# Patient Record
Sex: Female | Born: 1945 | Race: White | Hispanic: No | Smoking: Never smoker
Health system: Southern US, Community
[De-identification: ages and names within clinical notes are randomized; demographics above are authoritative.]

## PROBLEM LIST (undated history)

## (undated) DIAGNOSIS — F909 Attention-deficit hyperactivity disorder, unspecified type: Secondary | ICD-10-CM

## (undated) DIAGNOSIS — R61 Generalized hyperhidrosis: Secondary | ICD-10-CM

## (undated) DIAGNOSIS — L659 Nonscarring hair loss, unspecified: Secondary | ICD-10-CM

## (undated) DIAGNOSIS — IMO0002 Reserved for concepts with insufficient information to code with codable children: Secondary | ICD-10-CM

## (undated) DIAGNOSIS — F32A Depression, unspecified: Secondary | ICD-10-CM

## (undated) DIAGNOSIS — M75101 Unspecified rotator cuff tear or rupture of right shoulder, not specified as traumatic: Secondary | ICD-10-CM

## (undated) DIAGNOSIS — E669 Obesity, unspecified: Secondary | ICD-10-CM

## (undated) DIAGNOSIS — F419 Anxiety disorder, unspecified: Secondary | ICD-10-CM

## (undated) DIAGNOSIS — M79674 Pain in right toe(s): Secondary | ICD-10-CM

## (undated) DIAGNOSIS — L603 Nail dystrophy: Secondary | ICD-10-CM

## (undated) DIAGNOSIS — M1990 Unspecified osteoarthritis, unspecified site: Secondary | ICD-10-CM

## (undated) DIAGNOSIS — F329 Major depressive disorder, single episode, unspecified: Secondary | ICD-10-CM

## (undated) DIAGNOSIS — K219 Gastro-esophageal reflux disease without esophagitis: Secondary | ICD-10-CM

## (undated) DIAGNOSIS — G4700 Insomnia, unspecified: Secondary | ICD-10-CM

## (undated) DIAGNOSIS — C049 Malignant neoplasm of floor of mouth, unspecified: Secondary | ICD-10-CM

## (undated) DIAGNOSIS — M2040 Other hammer toe(s) (acquired), unspecified foot: Secondary | ICD-10-CM

## (undated) DIAGNOSIS — M797 Fibromyalgia: Secondary | ICD-10-CM

## (undated) DIAGNOSIS — M19049 Primary osteoarthritis, unspecified hand: Secondary | ICD-10-CM

## (undated) DIAGNOSIS — I10 Essential (primary) hypertension: Secondary | ICD-10-CM

## (undated) HISTORY — DX: Attention-deficit hyperactivity disorder, unspecified type: F90.9

## (undated) HISTORY — DX: Major depressive disorder, single episode, unspecified: F32.9

## (undated) HISTORY — DX: Primary osteoarthritis, unspecified hand: M19.049

## (undated) HISTORY — DX: Essential (primary) hypertension: I10

## (undated) HISTORY — DX: Nail dystrophy: L60.3

## (undated) HISTORY — DX: Malignant neoplasm of floor of mouth, unspecified: C04.9

## (undated) HISTORY — DX: Obesity, unspecified: E66.9

## (undated) HISTORY — DX: Depression, unspecified: F32.A

## (undated) HISTORY — DX: Unspecified osteoarthritis, unspecified site: M19.90

## (undated) HISTORY — DX: Unspecified rotator cuff tear or rupture of right shoulder, not specified as traumatic: M75.101

## (undated) HISTORY — DX: Pain in right toe(s): M79.674

## (undated) HISTORY — DX: Reserved for concepts with insufficient information to code with codable children: IMO0002

## (undated) HISTORY — DX: Other hammer toe(s) (acquired), unspecified foot: M20.40

## (undated) HISTORY — DX: Gastro-esophageal reflux disease without esophagitis: K21.9

## (undated) HISTORY — DX: Fibromyalgia: M79.7

## (undated) HISTORY — DX: Generalized hyperhidrosis: R61

## (undated) HISTORY — DX: Anxiety disorder, unspecified: F41.9

## (undated) HISTORY — DX: Nonscarring hair loss, unspecified: L65.9

## (undated) HISTORY — DX: Insomnia, unspecified: G47.00

---

## 1982-07-11 HISTORY — PX: VESICOVAGINAL FISTULA CLOSURE W/ TAH: SUR271

## 1993-07-11 HISTORY — PX: TONSILLECTOMY: SUR1361

## 1993-07-11 HISTORY — PX: OTHER SURGICAL HISTORY: SHX169

## 1998-04-29 ENCOUNTER — Encounter: Admission: RE | Admit: 1998-04-29 | Discharge: 1998-07-28 | Payer: Self-pay

## 1998-10-16 ENCOUNTER — Ambulatory Visit (HOSPITAL_COMMUNITY): Admission: RE | Admit: 1998-10-16 | Discharge: 1998-10-16 | Payer: Self-pay | Admitting: Neurosurgery

## 1998-10-26 ENCOUNTER — Ambulatory Visit (HOSPITAL_COMMUNITY): Admission: RE | Admit: 1998-10-26 | Discharge: 1998-10-26 | Payer: Self-pay | Admitting: Neurosurgery

## 2000-09-22 ENCOUNTER — Encounter: Payer: Self-pay | Admitting: Critical Care Medicine

## 2000-09-22 ENCOUNTER — Ambulatory Visit (HOSPITAL_COMMUNITY): Admission: RE | Admit: 2000-09-22 | Discharge: 2000-09-22 | Payer: Self-pay | Admitting: Critical Care Medicine

## 2004-12-31 ENCOUNTER — Encounter: Admission: RE | Admit: 2004-12-31 | Discharge: 2005-03-31 | Payer: Self-pay | Admitting: Orthotics

## 2005-04-01 ENCOUNTER — Encounter: Admission: RE | Admit: 2005-04-01 | Discharge: 2005-06-30 | Payer: Self-pay | Admitting: Orthotics

## 2009-09-02 DIAGNOSIS — C049 Malignant neoplasm of floor of mouth, unspecified: Secondary | ICD-10-CM

## 2009-09-02 HISTORY — DX: Malignant neoplasm of floor of mouth, unspecified: C04.9

## 2009-09-21 HISTORY — PX: OTHER SURGICAL HISTORY: SHX169

## 2010-03-29 ENCOUNTER — Encounter
Admission: RE | Admit: 2010-03-29 | Discharge: 2010-06-27 | Payer: Self-pay | Source: Home / Self Care | Attending: Family Medicine | Admitting: Family Medicine

## 2010-11-30 ENCOUNTER — Encounter: Payer: Self-pay | Admitting: Nurse Practitioner

## 2011-07-19 ENCOUNTER — Other Ambulatory Visit (HOSPITAL_COMMUNITY)
Admission: RE | Admit: 2011-07-19 | Discharge: 2011-07-19 | Disposition: A | Discharge status: Home / Self Care | Payer: Medicare Other | Source: Ambulatory Visit | Attending: Obstetrics & Gynecology | Admitting: Obstetrics & Gynecology

## 2011-07-19 DIAGNOSIS — Z1213 Encounter for screening for malignant neoplasm of small intestine: Secondary | ICD-10-CM | POA: Insufficient documentation

## 2011-09-29 LAB — HM DIABETES EYE EXAM

## 2012-09-26 ENCOUNTER — Encounter: Payer: Self-pay | Admitting: Family Medicine

## 2012-09-26 ENCOUNTER — Ambulatory Visit (INDEPENDENT_AMBULATORY_CARE_PROVIDER_SITE_OTHER): Payer: 59 | Admitting: Family Medicine

## 2012-09-26 VITALS — BP 124/68 | HR 84 | Temp 98.5°F | Ht 63.5 in | Wt 184.2 lb

## 2012-09-26 DIAGNOSIS — F900 Attention-deficit hyperactivity disorder, predominantly inattentive type: Secondary | ICD-10-CM

## 2012-09-26 DIAGNOSIS — Z7251 High risk heterosexual behavior: Secondary | ICD-10-CM

## 2012-09-26 DIAGNOSIS — R61 Generalized hyperhidrosis: Secondary | ICD-10-CM

## 2012-09-26 DIAGNOSIS — F411 Generalized anxiety disorder: Secondary | ICD-10-CM

## 2012-09-26 DIAGNOSIS — F988 Other specified behavioral and emotional disorders with onset usually occurring in childhood and adolescence: Secondary | ICD-10-CM

## 2012-09-26 DIAGNOSIS — Z209 Contact with and (suspected) exposure to unspecified communicable disease: Secondary | ICD-10-CM

## 2012-09-26 DIAGNOSIS — J069 Acute upper respiratory infection, unspecified: Secondary | ICD-10-CM

## 2012-09-26 DIAGNOSIS — L74519 Primary focal hyperhidrosis, unspecified: Secondary | ICD-10-CM

## 2012-09-26 DIAGNOSIS — M549 Dorsalgia, unspecified: Secondary | ICD-10-CM

## 2012-09-26 LAB — POCT URINALYSIS DIPSTICK
Bilirubin, UA: NEGATIVE
Blood, UA: NEGATIVE
Glucose, UA: NEGATIVE
Ketones, UA: NEGATIVE
Leukocytes, UA: NEGATIVE
Nitrite, UA: NEGATIVE
Protein, UA: NEGATIVE
Spec Grav, UA: 1.02
Urobilinogen, UA: NEGATIVE
pH, UA: 6

## 2012-09-26 LAB — HIV ANTIBODY (ROUTINE TESTING W REFLEX): HIV: NONREACTIVE

## 2012-09-26 LAB — RPR

## 2012-09-26 MED ORDER — ALPRAZOLAM 0.5 MG PO TABS: 0.5000 mg | ORAL_TABLET | Freq: Every evening | ORAL | Status: DC | PRN

## 2012-09-26 NOTE — Progress Notes (Signed)
Subjective:     Patient ID: Carla Watkins, female   DOB: April 15, 1946, 67 y.o.   MRN: [id number]  HPI Would like to get STD screening. Her partner of 30 years is involved with someone else sexually. In this encounter that her partner has is with somebody who is known to have a substance abuse problem. The patient is extremely scared of contracting a STD. She has no swollen glands. No rashes no vaginal discharge. No other symptoms to suggest an STD.  Sweating excessively. Would like to see an endocrinologist for this. The patient is convinced that she has endocrine problem. She also prefers to go to [facility] for evaluation and she requests this to the  Would like to see someone for ADD. Prefers to go to [facility].  Congestion in her ear.Had URI 2 weeks ago.  Needs her Xanax refilled.  She has chronic back pain and her back has spasm. She sees orthopedics in [place] and she has been postponing any surgical intervention.  Past Medical History  Diagnosis Date  . Hypertension   . Depression   . Asthma   . Fibromyalgia   . DDD (degenerative disc disease)   . Diabetes mellitus   . GERD (gastroesophageal reflux disease)     severe   . Obesity   . Hair loss     consistent with telogen effluvium   . 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 Anxiety   . Nail dystrophy   . Allergic rhinitis   . Insomnia   . Hyperhidrosis   . Osteoarthritis of finger     basal joint ----right thumb   . Hammer toe     painful   . Pain of toe of right foot     second toe   . Major depression     possible bipolar spectrum  disorder   . Right rotator cuff tear   . ADHD (attention deficit hyperactivity disorder)     refered for assesment    Past Surgical History  Procedure Laterality Date  . Tonsillectomy  1995    with Uvulectomy   . Vesicovaginal fistula closure w/ tah  1984  . 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 Left nostril  enlargement  1995   History   Social History  . Marital Status: Divorced    Spouse Name: N/A    Number of Children: N/A  . Years of Education: N/A   Occupational History  . retired    Social History Main Topics  . Smoking status: Never Smoker   . Smokeless tobacco: Not on file  . Alcohol Use: No  . Drug Use: No  . Sexually Active: Not on file   Other Topics Concern  . Not on file   Social History Narrative  . No narrative on file   No family history on file. Current Outpatient Prescriptions on File Prior to Visit  Medication Sig Dispense Refill  . Albuterol (VENTOLIN IN) Inhale into the lungs.        . ALPRAZolam (XANAX) 0.5 MG tablet Take 0.5 mg by mouth at bedtime as needed. Take half tablet by mouth at bedtime         . aspirin 81 MG chewable tablet Chew 81 mg by mouth daily.        Marland Kitchen BIOTIN 5000 PO Take by mouth daily.        . cetirizine (  ZYRTEC ALLERGY) 10 MG tablet Take 10 mg by mouth daily.        . DULoxetine (CYMBALTA) 60 MG capsule Take 60 mg by mouth daily.        Marland Kitchen esomeprazole (NEXIUM) 40 MG capsule Take 40 mg by mouth daily before breakfast.        . fish oil-omega-3 fatty acids 1000 MG capsule Take 2 g by mouth daily.        . Foot Care Products (DIABETIC INSOLES) MISC by Does not apply route. Patient also has Rx. For diabetic shoes         . Lecithin 400 MG CAPS Take by mouth daily.        . Mometasone Furoate (NASONEX NA) by Nasal route.        . Multiple Vitamin (MULTIVITAMIN) tablet Take 1 tablet by mouth daily.        . traMADol (ULTRAM-ER) 300 MG 24 hr tablet Take 300 mg by mouth every morning.        . TURMERIC PO Take 500 mcg by mouth daily.        . vitamin E 400 UNIT capsule Take 400 Units by mouth daily.        . ACAI PO Take by mouth daily.        . ALBUTEROL IN Inhale into the lungs as needed.        . busPIRone (BUSPAR) 15 MG tablet Take 15 mg by mouth 3 (three) times daily.        . calcium-vitamin D (OSCAL WITH D)  500-200 MG-UNIT per tablet Take 1 tablet by mouth 2 (two) times daily.        . Cyclobenzaprine HCl (AMRIX PO) Take by mouth at bedtime as needed. Pt was given samples       . Fluticasone-Salmeterol (ADVAIR DISKUS) 250-50 MCG/DOSE AEPB Inhale 1 puff into the lungs every 12 (twelve) hours.        Marland Kitchen GRAPE SEED PO Take by mouth daily.        Marland Kitchen L-Methylfolate-B6-B12 (METANX PO) Take by mouth daily.        . nebivolol (BYSTOLIC) 10 MG tablet Take 10 mg by mouth daily.        . Probiotic Product (PROBIOTIC PO) Take by mouth daily.        . psyllium (METAMUCIL) 58.6 % powder Take 1 packet by mouth as directed.        Marland Kitchen QUEtiapine (SEROQUEL) 50 MG tablet Take 50 mg by mouth at bedtime. Take half tablet by mouth daily          No current facility-administered medications on file prior to visit.   Allergies  Allergen Reactions  . Codeine   . Erythromycin   . Penicillins   . Septra (Bactrim)   . Sulfa Antibiotics     There is no immunization history on file for this patient. Prior to Admission medications   Medication Sig Start Date End Date Taking? Authorizing Provider  Albuterol (VENTOLIN IN) Inhale into the lungs.     Yes Historical Provider, MD  ALPRAZolam Prudy Feeler) 0.5 MG tablet Take 0.5 mg by mouth at bedtime as needed. Take half tablet by mouth at bedtime      Yes Historical Provider, MD  aspirin 81 MG chewable tablet Chew 81 mg by mouth daily.     Yes Historical Provider, MD  BIOTIN 5000 PO Take by mouth daily.     Yes Historical Provider, MD  cetirizine (  ZYRTEC ALLERGY) 10 MG tablet Take 10 mg by mouth daily.     Yes Historical Provider, MD  DULoxetine (CYMBALTA) 60 MG capsule Take 60 mg by mouth daily.     Yes Historical Provider, MD  esomeprazole (NEXIUM) 40 MG capsule Take 40 mg by mouth daily before breakfast.     Yes Historical Provider, MD  fish oil-omega-3 fatty acids 1000 MG capsule Take 2 g by mouth daily.     Yes Historical Provider, MD  Foot Care Products (DIABETIC INSOLES)  MISC by Does not apply route. Patient also has Rx. For diabetic shoes      Yes Historical Provider, MD  Lecithin 400 MG CAPS Take by mouth daily.     Yes Historical Provider, MD  Mometasone Furoate (NASONEX NA) by Nasal route.     Yes Historical Provider, MD  Multiple Vitamin (MULTIVITAMIN) tablet Take 1 tablet by mouth daily.     Yes Historical Provider, MD  traMADol (ULTRAM-ER) 300 MG 24 hr tablet Take 300 mg by mouth every morning.     Yes Historical Provider, MD  TURMERIC PO Take 500 mcg by mouth daily.     Yes Historical Provider, MD  vitamin E 400 UNIT capsule Take 400 Units by mouth daily.     Yes Historical Provider, MD  ACAI PO Take by mouth daily.      Historical Provider, MD  ALBUTEROL IN Inhale into the lungs as needed.      Historical Provider, MD  busPIRone (BUSPAR) 15 MG tablet Take 15 mg by mouth 3 (three) times daily.      Historical Provider, MD  calcium-vitamin D (OSCAL WITH D) 500-200 MG-UNIT per tablet Take 1 tablet by mouth 2 (two) times daily.      Historical Provider, MD  Cyclobenzaprine HCl (AMRIX PO) Take by mouth at bedtime as needed. Pt was given samples     Historical Provider, MD  Fluticasone-Salmeterol (ADVAIR DISKUS) 250-50 MCG/DOSE AEPB Inhale 1 puff into the lungs every 12 (twelve) hours.      Historical Provider, MD  GRAPE SEED PO Take by mouth daily.      Historical Provider, MD  L-Methylfolate-B6-B12 (METANX PO) Take by mouth daily.      Historical Provider, MD  nebivolol (BYSTOLIC) 10 MG tablet Take 10 mg by mouth daily.      Historical Provider, MD  Probiotic Product (PROBIOTIC PO) Take by mouth daily.      Historical Provider, MD  psyllium (METAMUCIL) 58.6 % powder Take 1 packet by mouth as directed.      Historical Provider, MD  QUEtiapine (SEROQUEL) 50 MG tablet Take 50 mg by mouth at bedtime. Take half tablet by mouth daily       Historical Provider, MD       Review of Systems  Constitutional: Positive for diaphoresis.  HENT: Negative.   Eyes:  Negative.   Respiratory: Negative.   Cardiovascular: Negative.   Gastrointestinal: Negative.   Endocrine: Positive for heat intolerance.  Genitourinary: Negative.   Musculoskeletal: Positive for back pain.  Allergic/Immunologic: Negative.   Neurological:       Easily distracted  Hematological: Negative.   Psychiatric/Behavioral: Positive for sleep disturbance.       Objective:   Physical Exam On examination she appeared in good health and spirits. Well developed, well nourished. Vital signs as documented.  Skin warm and dry and without overt rashes. Head & Neck without JVD. Lungs clear.  Heart exam notable for regular rhythm, normal sounds  and absence of murmurs, rubs or gallops. Abdomen unremarkable and without evidence of organomegaly, masses, or abdominal aortic enlargement.  Breast exam: not performed. Gyn Exam: Not performed. External Genitalia: Vagina:: Cervix: Uterus: Adnexae: R/V: Extremities nonedematous. Lymph nodes normal Spine decreased range of motion to 60 flexion. The right erector spinae muscle in the lumbar area is mildly tender and spasm.    Assessment:     Exposure to STD potential. Lumbar muscle spasm. History of degenerative disc disease with chronic back pain. Easily distracted. Possibility of ADD. Possibility of other psychological problems. Hyperhidrosis. Patient does not believe she has any cardiac issues associated with exertion. He claims to have had a stress test recent which was totally normal. Recent URI.    Plan:     Observe URI and ear congestion for now. Contact her orthopedist in [place] and request physical therapy referral. We'll do STD screening. Refilled her Xanax. Will refer her to endocrinologist and psychologist.

## 2012-09-27 LAB — HSV(HERPES SIMPLEX VRS) I + II AB-IGG
HSV 1 Glycoprotein G Ab, IgG: 10.28 IV — ABNORMAL HIGH
HSV 2 Glycoprotein G Ab, IgG: 0.11 IV

## 2012-09-27 LAB — HEPATITIS PANEL, ACUTE
HCV Ab: NEGATIVE
Hep A IgM: NEGATIVE
Hep B C IgM: NEGATIVE
Hepatitis B Surface Ag: NEGATIVE

## 2012-09-27 LAB — GC/CHLAMYDIA PROBE AMP, URINE
Chlamydia, Swab/Urine, PCR: NEGATIVE
GC Probe Amp, Urine: NEGATIVE

## 2012-09-27 NOTE — Progress Notes (Signed)
Subjective:     Patient ID: Carla Watkins, female   DOB: July 31, 1945, 67 y.o.   MRN: [id number]  HPI   Review of Systems     Objective:   Physical Exam     Assessment:         Plan:        Jaeshawn Silvio P. Modesto Charon, M.D.

## 2012-09-27 NOTE — Progress Notes (Signed)
Quick Note:  Call patient. Labs normal so far. Await the rest of the labs No change in plan. ______

## 2012-10-08 ENCOUNTER — Other Ambulatory Visit: Payer: Self-pay | Admitting: Nurse Practitioner

## 2012-10-12 ENCOUNTER — Other Ambulatory Visit: Payer: Self-pay | Admitting: *Deleted

## 2012-10-12 MED ORDER — QUETIAPINE FUMARATE 50 MG PO TABS: 150.0000 mg | ORAL_TABLET | Freq: Every day | ORAL | Status: DC

## 2012-10-12 NOTE — Telephone Encounter (Signed)
[facility] FAXED OVER REQUEST FOR SEROQUEL XR 150. THIS IS NOT DOSE IN COMPUTER. WILL SEND CHART BACK FOR REVIEW OF DOSE. THANKS

## 2012-10-15 ENCOUNTER — Telehealth: Payer: Self-pay | Admitting: Family Medicine

## 2012-10-15 DIAGNOSIS — R52 Pain, unspecified: Secondary | ICD-10-CM

## 2012-10-16 ENCOUNTER — Telehealth: Payer: Self-pay | Admitting: *Deleted

## 2012-10-16 NOTE — Telephone Encounter (Signed)
Please review dose of Seroquel. Pharmacy says that dose needs to be changed. Sending chart back for review. Thank you

## 2012-10-16 NOTE — Telephone Encounter (Signed)
Sure. Refer to Rheumatologist in [place]. Dr Artis Flock.

## 2012-10-16 NOTE — Telephone Encounter (Signed)
Seroquel not listed in chart as med. Call patient and find out what she has been taking!

## 2012-10-18 ENCOUNTER — Other Ambulatory Visit: Payer: Self-pay | Admitting: *Deleted

## 2012-10-18 MED ORDER — QUETIAPINE FUMARATE 50 MG PO TABS: ORAL_TABLET | ORAL | Status: DC

## 2012-10-18 MED ORDER — FLUCONAZOLE 150 MG PO TABS: 150.0000 mg | ORAL_TABLET | Freq: Once | ORAL | Status: DC

## 2012-10-18 NOTE — Telephone Encounter (Signed)
rx sent to pharmacy

## 2012-10-18 NOTE — Telephone Encounter (Signed)
RX sent to pharmacy. According to computer pateitn takes Seroquel 50mg  3 Qhs so that is what was ordered.

## 2012-10-18 NOTE — Telephone Encounter (Signed)
Patient has been taking seroquel XR 150mg .

## 2012-10-25 ENCOUNTER — Telehealth: Payer: Self-pay | Admitting: Nurse Practitioner

## 2012-10-25 ENCOUNTER — Other Ambulatory Visit: Payer: Self-pay

## 2012-10-25 MED ORDER — FLUCONAZOLE 150 MG PO TABS: 150.0000 mg | ORAL_TABLET | Freq: Once | ORAL | Status: DC

## 2012-10-25 MED ORDER — QUETIAPINE FUMARATE ER 150 MG PO TB24: 150.0000 mg | ORAL_TABLET | Freq: Every day | ORAL | Status: DC

## 2012-10-25 NOTE — Telephone Encounter (Signed)
This was filled on 10/18/12 .  Was sent back from Alric Quan that patient is requesting 12 pills

## 2012-11-01 ENCOUNTER — Encounter: Payer: Self-pay | Admitting: *Deleted

## 2012-11-01 NOTE — Telephone Encounter (Signed)
This encounter was created in error - please disregard.

## 2012-11-02 ENCOUNTER — Other Ambulatory Visit: Payer: Self-pay | Admitting: *Deleted

## 2012-11-02 MED ORDER — DULOXETINE HCL 30 MG PO CPEP: 30.0000 mg | ORAL_CAPSULE | Freq: Every day | ORAL | Status: DC

## 2012-11-02 MED ORDER — DULOXETINE HCL 60 MG PO CPEP: 60.0000 mg | ORAL_CAPSULE | Freq: Every day | ORAL | Status: DC

## 2012-11-09 ENCOUNTER — Telehealth: Payer: Self-pay | Admitting: Nurse Practitioner

## 2012-11-09 ENCOUNTER — Telehealth: Payer: Self-pay | Admitting: Family Medicine

## 2012-11-09 NOTE — Telephone Encounter (Signed)
Tramadol not on her list of meds

## 2012-11-12 NOTE — Telephone Encounter (Signed)
Have pharmacy send e scribe so i can see when it was filled last

## 2012-11-12 NOTE — Telephone Encounter (Signed)
[facility] is sending over they said they sent it Friday and someone denied it

## 2012-11-12 NOTE — Telephone Encounter (Signed)
Patients chart is on your desk it says she is on tramadol

## 2012-11-14 ENCOUNTER — Other Ambulatory Visit: Payer: Self-pay | Admitting: *Deleted

## 2012-11-14 NOTE — Telephone Encounter (Signed)
Has appt with you on 11/16/12, last filled 10/08/12

## 2012-11-15 ENCOUNTER — Other Ambulatory Visit: Payer: Self-pay | Admitting: Family Medicine

## 2012-11-15 ENCOUNTER — Other Ambulatory Visit: Payer: Self-pay | Admitting: Obstetrics & Gynecology

## 2012-11-15 DIAGNOSIS — E119 Type 2 diabetes mellitus without complications: Secondary | ICD-10-CM

## 2012-11-15 NOTE — Telephone Encounter (Signed)
  Carla Watkins is very concerned that she has not been able to have her tramadol filled, Dr Modesto Charon said he would not fill her tramadol because  She has documented in her chart that she is taking cymbalta amd the two should not be given together due to risk of seizures.  She has an appt with Dr Modesto Charon tomorrow 11-16-12, she said she was thinking about canceling it.  I advised her to keep it so Dr Modesto Charon can evaluate her medications and prescribe whhat he thinks she needs.

## 2012-11-15 NOTE — Telephone Encounter (Signed)
Patient needs to know that the combination of tramadol with cymbalta increases the chances of seizures before I refill it. Thanks.

## 2012-11-16 ENCOUNTER — Ambulatory Visit (INDEPENDENT_AMBULATORY_CARE_PROVIDER_SITE_OTHER): Payer: 59 | Admitting: Family Medicine

## 2012-11-16 ENCOUNTER — Encounter: Payer: Self-pay | Admitting: Family Medicine

## 2012-11-16 VITALS — BP 107/72 | HR 73 | Temp 97.8°F | Wt 180.6 lb

## 2012-11-16 DIAGNOSIS — C060 Malignant neoplasm of cheek mucosa: Secondary | ICD-10-CM

## 2012-11-16 DIAGNOSIS — D72829 Elevated white blood cell count, unspecified: Secondary | ICD-10-CM

## 2012-11-16 DIAGNOSIS — K1120 Sialoadenitis, unspecified: Secondary | ICD-10-CM

## 2012-11-16 DIAGNOSIS — E119 Type 2 diabetes mellitus without complications: Secondary | ICD-10-CM

## 2012-11-16 DIAGNOSIS — E785 Hyperlipidemia, unspecified: Secondary | ICD-10-CM

## 2012-11-16 LAB — POCT CBC
Granulocyte percent: 67.9 %G (ref 37–80)
HCT, POC: 38.7 % (ref 37.7–47.9)
Hemoglobin: 12.9 g/dL (ref 12.2–16.2)
Lymph, poc: 3.1 (ref 0.6–3.4)
MCH, POC: 29.1 pg (ref 27–31.2)
MCHC: 33.5 g/dL (ref 31.8–35.4)
MCV: 86.9 fL (ref 80–97)
MPV: 8 fL (ref 0–99.8)
POC Granulocyte: 8.5 — AB (ref 2–6.9)
POC LYMPH PERCENT: 24.5 %L (ref 10–50)
Platelet Count, POC: 363 10*3/uL (ref 142–424)
RBC: 4.5 M/uL (ref 4.04–5.48)
RDW, POC: 13.8 %
WBC: 12.5 10*3/uL — AB (ref 4.6–10.2)

## 2012-11-16 LAB — POCT GLYCOSYLATED HEMOGLOBIN (HGB A1C): Hemoglobin A1C: 6.1

## 2012-11-16 MED ORDER — TRAMADOL HCL ER 300 MG PO TB24: 300.0000 mg | ORAL_TABLET | Freq: Every day | ORAL | Status: DC

## 2012-11-16 MED ORDER — CLINDAMYCIN HCL 300 MG PO CAPS: 300.0000 mg | ORAL_CAPSULE | Freq: Three times a day (TID) | ORAL | Status: DC

## 2012-11-16 NOTE — Progress Notes (Signed)
Patient ID: Carla Watkins, female   DOB: 1945-10-13, 67 y.o.   MRN: [id number] SUBJECTIVE: HPI:  Patient is here for follow up of Diabetes Mellitus/Depression/Hyperlipidemia  .Symptoms of DM:has had no Nocturia ,deniesUrinary Frequency ,denies Blurred vision ,deniesDizziness,denies.Dysuria,deniesparesthesias, deniesextremity pain or ulcers.Marland Kitchendenieschest pain. .has hadan annual eye exam. do check the feet. doescheck CBGs. Average CBG:________.Marland Kitchen deniesto episodes of hypoglycemia. doeshave an emergency hypoglycemic plan. admits toCompliance with medications. deniesProblems with medications.  PMH/PSH: reviewed/updated in Epic  SH/FH: reviewed/updated in Epic  Allergies: reviewed/updated in Epic  Medications: reviewed/updated in Epic  Immunizations: reviewed/updated in Epic  ROS: As above in the HPI. All other systems are stable or negative.  OBJECTIVE: APPEARANCE:  Patient in no acute distress.The patient appeared well nourished and normally developed. Acyanotic. Waist: VITAL SIGNS:BP 107/72  Pulse 73  Temp(Src) 97.8 F (36.6 C) (Oral)  Wt 180 lb 9.6 oz (81.92 kg)  BMI 31.49 kg/m2 Obese WF. Discussion centered around the traumatic relationship she has had for many decades  SKIN: warm and  Dry without overt rashes, tattoos and scars  HEAD and Neck: without JVD, Head and scalp: normal Eyes:No scleral icterus. Fundi normal, eye movements normal. Ears: Auricle normal, canal normal, Tympanic membranes normal, insufflation normal. Nose: normal Throat:  Swelling of the soft tissues below the tongue on the right side.This could be a  sialadenits.normal Neck & thyroid: normal  CHEST & LUNGS: Chest wall: normal Lungs: Clear  CVS: Reveals the PMI to be normally located. Regular rhythm, First and Second Heart sounds are normal,  absence of murmurs, rubs or gallops. Peripheral vasculature: Radial pulses: normal Dorsal pedis pulses: normal Posterior pulses:  normal  ABDOMEN:  Appearance:obese. Benign,, no organomegaly, no masses, no Abdominal Aortic enlargement. No Guarding , no rebound. No Bruits. Bowel sounds: normal  RECTAL: N/A GU: N/A  EXTREMETIES: nonedematous. Both Femoral and Pedal pulses are normal.   NEUROLOGIC: oriented to time,place and person; nonfocal.  ASSESSMENT: DM (diabetes mellitus) - Plan: COMPLETE METABOLIC PANEL WITH GFR, POCT glycosylated hemoglobin (Hb A1C)  HLD (hyperlipidemia) - Plan: COMPLETE METABOLIC PANEL WITH GFR, NMR Lipoprofile with Lipids  Squamous cell cancer of buccal mucosa - Plan: POCT CBC  Leukocytosis - Plan: clindamycin (CLEOCIN) 300 MG capsule  Sialadenitis - Plan: clindamycin (CLEOCIN) 300 MG capsule  PLAN:  Orders Placed This Encounter  Procedures  . COMPLETE METABOLIC PANEL WITH GFR  . NMR Lipoprofile with Lipids  . POCT CBC  . POCT glycosylated hemoglobin (Hb A1C)   Results for orders placed in visit on 11/16/12  POCT CBC      Result Value Range   WBC 12.5 (*) 4.6 - 10.2 K/uL   Lymph, poc 3.1  0.6 - 3.4   POC LYMPH PERCENT 24.5  10 - 50 %L   POC Granulocyte 8.5 (*) 2 - 6.9   Granulocyte percent 67.9  37 - 80 %G   RBC 4.5  4.04 - 5.48 M/uL   Hemoglobin 12.9  12.2 - 16.2 g/dL   HCT, POC 78.2  95.6 - 47.9 %   MCV 86.9  80 - 97 fL   MCH, POC 29.1  27 - 31.2 pg   MCHC 33.5  31.8 - 35.4 g/dL   RDW, POC 21.3     Platelet Count, POC 363.0  142 - 424 K/uL   MPV 8.0  0 - 99.8 fL  POCT GLYCOSYLATED HEMOGLOBIN (HGB A1C)      Result Value Range   Hemoglobin A1C 6.1 %     Meds  ordered this encounter  Medications  . DISCONTD: metFORMIN (GLUCOPHAGE) 1000 MG tablet    Sig: Take 1,000 mg by mouth 2 (two) times daily with a meal.  . metFORMIN (GLUCOPHAGE) 1000 MG tablet    Sig: Take 1,000 mg by mouth 2 (two) times daily with a meal.  . clindamycin (CLEOCIN) 300 MG capsule    Sig: Take 1 capsule (300 mg total) by mouth 3 (three) times daily.    Dispense:  30 capsule    Refill:   0  Supportive therapy for 40 minutes. Diet and exercise. Recommended reading Book "The Secret" RTc 2 weeks to recheck swelling of the oral cavity and CBC.  Kamilla Hands P. Modesto Charon, M.D.

## 2012-11-17 LAB — COMPLETE METABOLIC PANEL WITH GFR
ALT: 25 U/L (ref 0–35)
AST: 23 U/L (ref 0–37)
Albumin: 4 g/dL (ref 3.5–5.2)
Alkaline Phosphatase: 86 U/L (ref 39–117)
BUN: 27 mg/dL — ABNORMAL HIGH (ref 6–23)
CO2: 27 mEq/L (ref 19–32)
Calcium: 10 mg/dL (ref 8.4–10.5)
Chloride: 96 mEq/L (ref 96–112)
Creat: 1.32 mg/dL — ABNORMAL HIGH (ref 0.50–1.10)
GFR, Est African American: 49 mL/min — ABNORMAL LOW
GFR, Est Non African American: 42 mL/min — ABNORMAL LOW
Glucose, Bld: 105 mg/dL — ABNORMAL HIGH (ref 70–99)
Potassium: 4.1 mEq/L (ref 3.5–5.3)
Sodium: 135 mEq/L (ref 135–145)
Total Bilirubin: 0.4 mg/dL (ref 0.3–1.2)
Total Protein: 7 g/dL (ref 6.0–8.3)

## 2012-11-20 LAB — NMR LIPOPROFILE WITH LIPIDS
Cholesterol, Total: 183 mg/dL (ref <200)
HDL Particle Number: 33.2 umol/L (ref >=30.5)
HDL Size: 8.7 nm — ABNORMAL LOW (ref >=9.2)
HDL-C: 51 mg/dL (ref >=40)
LDL (calc): 96 mg/dL (ref <100)
LDL Particle Number: 1594 nmol/L — ABNORMAL HIGH (ref <1000)
LDL Size: 20.5 nm — ABNORMAL LOW (ref >20.5)
LP-IR Score: 71 — ABNORMAL HIGH (ref <=45)
Large HDL-P: 3.9 umol/L — ABNORMAL LOW (ref >=4.8)
Large VLDL-P: 5.8 nmol/L — ABNORMAL HIGH (ref <=2.7)
Small LDL Particle Number: 860 nmol/L — ABNORMAL HIGH (ref <=527)
Triglycerides: 180 mg/dL — ABNORMAL HIGH (ref <150)
VLDL Size: 50.6 nm — ABNORMAL HIGH (ref <=46.6)

## 2012-11-21 ENCOUNTER — Other Ambulatory Visit: Payer: Self-pay | Admitting: Family Medicine

## 2012-11-21 ENCOUNTER — Telehealth: Payer: Self-pay | Admitting: *Deleted

## 2012-11-21 DIAGNOSIS — K121 Other forms of stomatitis: Secondary | ICD-10-CM

## 2012-11-21 MED ORDER — CLOTRIMAZOLE 10 MG MT LOZG: 10.0000 mg | LOZENGE | Freq: Every day | OROMUCOSAL | Status: DC

## 2012-11-21 NOTE — Telephone Encounter (Signed)
May be from the antibiotics. Will order mycelex troche in Epic

## 2012-11-21 NOTE — Telephone Encounter (Signed)
Pt aware rx at drug store   PT WANTS TO START MEDICATION FOR CHOLESTEROL --SOMETHING WITHOUT THE SIDE EFFECTS.

## 2012-11-21 NOTE — Telephone Encounter (Signed)
She has developed some raw patches on her tongue since starting the antibiotic on 11/17/12.  She denies any white patches.

## 2012-11-21 NOTE — Telephone Encounter (Signed)
Hold on the lipid meds for a couple of weeks until we have the tongue and mouth problems cleared up. She was supposed to follow up after antibiotics

## 2012-11-22 NOTE — Telephone Encounter (Signed)
Pt.notified

## 2012-11-23 NOTE — Telephone Encounter (Signed)
error 

## 2012-11-26 NOTE — Telephone Encounter (Signed)
Call has been handled.

## 2012-11-30 ENCOUNTER — Ambulatory Visit (INDEPENDENT_AMBULATORY_CARE_PROVIDER_SITE_OTHER): Payer: 59 | Admitting: Family Medicine

## 2012-11-30 ENCOUNTER — Encounter: Payer: Self-pay | Admitting: Family Medicine

## 2012-11-30 VITALS — BP 132/79 | HR 77 | Temp 98.2°F | Ht 63.5 in | Wt 182.0 lb

## 2012-11-30 DIAGNOSIS — B3731 Acute candidiasis of vulva and vagina: Secondary | ICD-10-CM

## 2012-11-30 DIAGNOSIS — E785 Hyperlipidemia, unspecified: Secondary | ICD-10-CM

## 2012-11-30 DIAGNOSIS — B373 Candidiasis of vulva and vagina: Secondary | ICD-10-CM

## 2012-11-30 MED ORDER — PRAVASTATIN SODIUM 10 MG PO TABS: 10.0000 mg | ORAL_TABLET | Freq: Every day | ORAL | Status: DC

## 2012-11-30 MED ORDER — FLUCONAZOLE 150 MG PO TABS: 150.0000 mg | ORAL_TABLET | Freq: Once | ORAL | Status: DC

## 2012-12-01 NOTE — Progress Notes (Signed)
Subjective:     Patient ID: Carla Watkins, female   DOB: 1946/03/05, 67 y.o.   MRN: [id number]  HPI Patient comes in for her follow up appointment in regards to mouth swelling involving the floor of the mouth and neck and elevated wbc.She claims that she had gone to [facility] and the side of her tongue was biopsied and awaiting her biopsy report. She comes with a notebook with notes that she referred to : She is upset about how she is treated, claiming that she does not have access to the clinic for follow up and when she has a problem she has to come in, when she believes she can be treated over the phone. She claims that she always gets samples for everything and asks if it is not [name] philosophy to always give samples as she is expecting. She claims that [name] did not get back to her about her sore mouth. This is in contradiction to my documentation which I showed her in  The messages in her EPIC record. She claims that My nurse never called in Rx for the troches and when confronted : She admits never checked the pharmacy for the Rx.She then changed her claim to imply that my nurse gave her different information about the treatment, however, after letting the patient continue to make accusations without any basis, I informed her that her claims were inaccurate. I explained to her that I sat right across from my nurse in the office and that I am the only Provider here that has his/her nurse in the office with a desk for coordinated discussion on patient management and that I was present with my nurse as witness to the conversation with patients, especially the one she had with her, and the documentation reflects precisely what my nurse told her in a professional manner. And the patient was informed that she was inaccurate in her accusations and that her claims and demands is not consistent with having a constructive professional doctor - patient relationship. She went on to change the subject  about her serology tests and HSV positivity. See the documentation of the visit where she had  requested the tests due to her former partner may have exposed her to infectious  Disease and she was desirous of tests and she was educated about her tests , the tresults prior to and on her last visit. I informed patient that since we could not have a trusting honest Doctor - Patient relationship she needs to establish with another provider. She then changed from being accusatory to claiming that she wanted to" clear the air"  And be clear about her expectations. I emphasized that her expectations was not consistent with what I considered a professional Doctor - patient  Relationship. She was told to request further care within the practice with another provider. I listed all the other5  providers available and she only wanted to see our newest Nurse Practitioner.-Carla Watkins. She went on to claim that the rheumatologist at [facility] was a God  Send and that the Rx for celebrex from the rheumatologist cured all her pain ad that her diagnosis was osteoarthritis. I expressed  How pleased I was that the rheumatologist was able to help her. She claims that I did not want  To treat her with a statin to lower the cholesterol. She claims that she had spoken with her cardiologist recently and he told her that her PP can Rx a low dose statin to treat  her levels of cholesterol.again  I showed her the documentation that she was told to wait 2 weeks because of other medication problems before starting another one. Review of Systems     Objective:   Physical Exam    the floor of the mouth swelling and the salivary gland  Swelling had resolved. Here was a wound on the left side of the tongue, This  Exam was performed prior to our discussion above. Assessment:     Difficult and overly demanding and manipulative individual. Who was inaccurate and accusatory probably for secondary gain.  Polypharmacy on 28  substances, both prescriptions and OTC products.  Patient has been advised before about the need for psychologist counselling and referral in the past.    Plan:     Denied benzodiazepine request today. Refilled the diflucan . Added low dose pravastatin. Informed patient that she is not discharged from the practice, but I am not willing to provide her with whatever she demands all the time because it may not be appropriate. Teh site manager present today was informed of he outcome today and that I have informed the patient that our doctor-patient/  Primary care relationship is terminated. She has other choices in the practice.

## 2012-12-10 ENCOUNTER — Telehealth: Payer: Self-pay | Admitting: Family Medicine

## 2012-12-12 ENCOUNTER — Telehealth: Payer: Self-pay | Admitting: Family Medicine

## 2012-12-13 ENCOUNTER — Other Ambulatory Visit: Payer: Self-pay | Admitting: Family Medicine

## 2012-12-14 ENCOUNTER — Other Ambulatory Visit: Payer: Self-pay | Admitting: *Deleted

## 2012-12-14 NOTE — Telephone Encounter (Signed)
LAST RF 11/16/12. PRINT AND CALL PT WHEN READY. HAS APPT WITH YOU IN [place].

## 2012-12-17 NOTE — Telephone Encounter (Signed)
PT NOTIFIED RX READY TO BE PICKED UP.

## 2012-12-18 ENCOUNTER — Other Ambulatory Visit: Payer: Self-pay | Admitting: Family Medicine

## 2012-12-18 NOTE — Telephone Encounter (Signed)
Error

## 2012-12-25 ENCOUNTER — Telehealth: Payer: Self-pay | Admitting: General Practice

## 2012-12-26 ENCOUNTER — Telehealth: Payer: Self-pay | Admitting: General Practice

## 2012-12-27 NOTE — Telephone Encounter (Signed)
Pt will be advised to get a copy of records from [facility].

## 2012-12-31 NOTE — Telephone Encounter (Signed)
Pt aware, Bystolic 10 mg ,qty 5 bottles of samples.

## 2013-01-01 ENCOUNTER — Telehealth: Payer: Self-pay

## 2013-01-01 ENCOUNTER — Other Ambulatory Visit: Payer: Self-pay | Admitting: Family Medicine

## 2013-01-02 ENCOUNTER — Other Ambulatory Visit: Payer: Self-pay | Admitting: General Practice

## 2013-01-02 ENCOUNTER — Telehealth: Payer: Self-pay

## 2013-01-02 DIAGNOSIS — E109 Type 1 diabetes mellitus without complications: Secondary | ICD-10-CM

## 2013-01-02 DIAGNOSIS — E119 Type 2 diabetes mellitus without complications: Secondary | ICD-10-CM

## 2013-01-02 NOTE — Telephone Encounter (Signed)
Please inform patient that referral made. thx

## 2013-01-02 NOTE — Telephone Encounter (Signed)
Pt aware ref to be set up

## 2013-01-02 NOTE — Telephone Encounter (Signed)
Wants referral to [facility] in [place]

## 2013-01-03 NOTE — Telephone Encounter (Signed)
.  .  .        xx

## 2013-01-07 ENCOUNTER — Telehealth: Payer: Self-pay | Admitting: General Practice

## 2013-01-07 NOTE — Telephone Encounter (Signed)
APPT GIVEN FOR 01/08/2013 WITH [facility]E

## 2013-01-08 ENCOUNTER — Ambulatory Visit (INDEPENDENT_AMBULATORY_CARE_PROVIDER_SITE_OTHER): Payer: 59 | Admitting: General Practice

## 2013-01-08 ENCOUNTER — Encounter: Payer: Self-pay | Admitting: General Practice

## 2013-01-08 ENCOUNTER — Other Ambulatory Visit: Payer: Self-pay | Admitting: Family Medicine

## 2013-01-08 VITALS — BP 126/71 | HR 74 | Temp 98.0°F | Ht 63.5 in | Wt 185.5 lb

## 2013-01-08 DIAGNOSIS — J069 Acute upper respiratory infection, unspecified: Secondary | ICD-10-CM

## 2013-01-08 MED ORDER — AZITHROMYCIN 250 MG PO TABS: ORAL_TABLET | ORAL | Status: DC

## 2013-01-08 MED ORDER — BENZONATATE 100 MG PO CAPS: 100.0000 mg | ORAL_CAPSULE | Freq: Two times a day (BID) | ORAL | Status: DC | PRN

## 2013-01-08 NOTE — Progress Notes (Signed)
  Subjective:    Patient ID: Carla Watkins, female    DOB: [birth year], 67 y.o.   MRN: [id number]  HPI Patient presents today with cough. She reports onset was one week ago. She denies productive cough. She denies taking OTC medications. She reports nasal congestion.     Review of Systems  Constitutional: Negative for fever and chills.  HENT: Negative for neck pain and neck stiffness.   Respiratory: Positive for cough. Negative for chest tightness and shortness of breath.   Cardiovascular: Negative for chest pain and palpitations.  Neurological: Negative for dizziness, weakness and headaches.       Objective:   Physical Exam  Constitutional: She is oriented to person, place, and time. She appears well-developed and well-nourished.  HENT:  Head: Normocephalic and atraumatic.  Right Ear: External ear normal.  Left Ear: External ear normal.  Mouth/Throat: Posterior oropharyngeal erythema present.  Cardiovascular: Normal rate, regular rhythm and normal heart sounds.   Pulmonary/Chest: Effort normal and breath sounds normal. No respiratory distress. She exhibits no tenderness.  Neurological: She is alert and oriented to person, place, and time.  Skin: Skin is warm and dry.  Psychiatric: She has a normal mood and affect.          Assessment & Plan:  1. Acute upper respiratory infections of unspecified site - azithromycin (ZITHROMAX) 250 MG tablet; Take as directed  Dispense: 6 tablet; Refill: 0 - benzonatate (TESSALON) 100 MG capsule; Take 1 capsule (100 mg total) by mouth 2 (two) times daily as needed for cough.  Dispense: 20 capsule; Refill: 0 -Increase fluid intake Motrin or tylenol OTC OTC decongestant Throat lozenges if help New toothbrush in 3 days Proper handwashing Patient verbalized understanding Coralie Keens, FNP-C

## 2013-01-21 ENCOUNTER — Telehealth: Payer: Self-pay | Admitting: Physician Assistant

## 2013-01-21 NOTE — Telephone Encounter (Signed)
Patient scheduled with Ander Slade, FNP tomorrow afternoon.  She requested to see him and this was his first available.

## 2013-01-22 ENCOUNTER — Encounter: Payer: Self-pay | Admitting: Family Medicine

## 2013-01-22 ENCOUNTER — Ambulatory Visit (INDEPENDENT_AMBULATORY_CARE_PROVIDER_SITE_OTHER): Payer: 59

## 2013-01-22 ENCOUNTER — Ambulatory Visit (INDEPENDENT_AMBULATORY_CARE_PROVIDER_SITE_OTHER): Payer: 59 | Admitting: Family Medicine

## 2013-01-22 VITALS — BP 112/70 | HR 79 | Temp 98.4°F | Wt 188.6 lb

## 2013-01-22 DIAGNOSIS — M25552 Pain in left hip: Secondary | ICD-10-CM

## 2013-01-22 DIAGNOSIS — M25559 Pain in unspecified hip: Secondary | ICD-10-CM

## 2013-01-22 MED ORDER — DICLOFENAC SODIUM 75 MG PO TBEC: 75.0000 mg | DELAYED_RELEASE_TABLET | Freq: Two times a day (BID) | ORAL | Status: DC

## 2013-01-22 NOTE — Progress Notes (Signed)
  Subjective:    Patient ID: Carla Watkins, female    DOB: 11-13-1945, 67 y.o.   MRN: [id number]  HPI This 67 y.o. female presents for evaluation of left hip pain.  She has taken voltaren for this in the past and this helps.   Review of Systems C/o left hip pain. No chest pain, SOB, HA, dizziness, vision change, N/V, diarrhea, constipation, dysuria, urinary urgency or frequency or rash.     Objective:   Physical Exam  Vital signs noted  Well developed well nourished female.  HEENT - Head atraumatic Normocephalic                Eyes - PERRLA, Conjuctiva - clear Sclera- Clear EOMI                Ears - EAC's Wnl TM's Wnl Gross Hearing WNL                Nose - Nares patent                 Throat - oropharanx wnl Respiratory - Lungs CTA bilateral Cardiac - RRR S1 and S2 without murmur MS - TTP Left greater trochanter bursa Neuro - Grossly intact.       Assessment & Plan:  Hip pain, acute, left - Plan: DG Hip Complete Left, diclofenac (VOLTAREN) 75 MG EC tablet. Discussed getting an injection and declines. Left hip xray preliminary read is normal.  Follow up prn.

## 2013-01-23 ENCOUNTER — Telehealth: Payer: Self-pay | Admitting: Family Medicine

## 2013-01-23 ENCOUNTER — Other Ambulatory Visit: Payer: Self-pay | Admitting: Family Medicine

## 2013-01-23 DIAGNOSIS — C760 Malignant neoplasm of head, face and neck: Secondary | ICD-10-CM

## 2013-01-23 NOTE — Telephone Encounter (Signed)
No answer

## 2013-01-24 ENCOUNTER — Encounter: Payer: Self-pay | Admitting: Family Medicine

## 2013-01-24 ENCOUNTER — Ambulatory Visit (INDEPENDENT_AMBULATORY_CARE_PROVIDER_SITE_OTHER): Payer: 59 | Admitting: Family Medicine

## 2013-01-24 VITALS — BP 108/57 | HR 79 | Temp 97.2°F | Wt 190.4 lb

## 2013-01-24 DIAGNOSIS — R9389 Abnormal findings on diagnostic imaging of other specified body structures: Secondary | ICD-10-CM

## 2013-01-24 DIAGNOSIS — C760 Malignant neoplasm of head, face and neck: Secondary | ICD-10-CM | POA: Insufficient documentation

## 2013-01-24 DIAGNOSIS — E119 Type 2 diabetes mellitus without complications: Secondary | ICD-10-CM

## 2013-01-24 NOTE — Progress Notes (Signed)
  Subjective:    Patient ID: Carla Watkins, female    DOB: 24-Feb-1946, 67 y.o.   MRN: [id number]  HPI This 67 y.o. female presents for evaluation of abnormal hip xray.  She was having difficulty With left hip bursitis and had an xray which showed possible sclerotic foci on left ileum and Dr. Tyron Russell Radiologist recommended a full body nuclear bone scan.  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  She has been cleared through her ENT since then.  She sees RA Dr. Kathi Ludwig in [place] And she had Dr. Kathi Ludwig call me and we spoke yesterday and Dr. Kathi Ludwig agreed with Dr. Tyron Russell that she  Should get the bone scan.  She is following up with labs and xray reports from Dr. Kathi Ludwig.  She has hx of OA, back pain, and spondylothesis.  She has hx of T2DM.  Her last hgb aic from 4/14 at Dr. Kathi Ludwig office was  6.1%.  Her other labs were normal.    Review of Systems No chest pain, SOB, HA, dizziness, vision change, N/V, diarrhea, constipation, dysuria, urinary urgency or frequency, myalgias, arthralgias or rash.     Objective:   Physical Exam Vital signs noted  Well developed well nourished female.  HEENT - Head atraumatic Normocephalic Respiratory - Lungs CTA bilateral Cardiac - RRR S1 and S2 without murmur       Assessment & Plan:  Abnormal x-ray Discussed with patient to go ahead with the bone scan.  Reassured her that the xray report is not a report of cancer and That this is recommended because of her hx of head and neck CA.  She is understanding and will get the bone scan done Advised her to follow up in 2 months or prn if she has any concerns or questions.

## 2013-01-24 NOTE — Patient Instructions (Signed)
Hip Pain  The hips join the upper legs to the lower pelvis. The bones, cartilage, tendons, and muscles of the hip joint perform a lot of work each day holding your body weight and allowing you to move around.  Hip pain is a common symptom. It can range from a minor ache to severe pain on 1 or both hips. Pain may be felt on the inside of the hip joint near the groin, or the outside near the buttocks and upper thigh. There may be swelling or stiffness as well. It occurs more often when a person walks or performs activity. There are many reasons hip pain can develop.  CAUSES   It is important to work with your caregiver to identify the cause since many conditions can impact the bones, cartilage, muscles, and tendons of the hips. Causes for hip pain include:  · Broken (fractured) bones.  · Separation of the thighbone from the hip socket (dislocation).  · Torn cartilage of the hip joint.  · Swelling (inflammation) of a tendon (tendonitis), the sac within the hip joint (bursitis), or a joint.  · A weakening in the abdominal wall (hernia), affecting the nerves to the hip.  · Arthritis in the hip joint or lining of the hip joint.  · Pinched nerves in the back, hip, or upper thigh.  · A bulging disc in the spine (herniated disc).  · Rarely, bone infection or cancer.  DIAGNOSIS   The location of your hip pain will help your caregiver understand what may be causing the pain. A diagnosis is based on your medical history, your symptoms, results from your physical exam, and results from diagnostic tests. Diagnostic tests may include X-ray exams, a computerized magnetic scan (magnetic resonance imaging, MRI), or bone scan.  TREATMENT   Treatment will depend on the cause of your hip pain. Treatment may include:  · Limiting activities and resting until symptoms improve.  · Crutches or other walking supports (a cane or brace).  · Ice, elevation, and compression.  · Physical therapy or home exercises.  · Shoe inserts or special  shoes.  · Losing weight.  · Medications to reduce pain.  · Undergoing surgery.  HOME CARE INSTRUCTIONS   · Only take over-the-counter or prescription medicines for pain, discomfort, or fever as directed by your caregiver.  · Put ice on the injured area:  · Put ice in a plastic bag.  · Place a towel between your skin and the bag.  · Leave the ice on for 15-20 minutes at a time, 3-4 times a day.  · Keep your leg raised (elevated) when possible to lessen swelling.  · Avoid activities that cause pain.  · Follow specific exercises as directed by your caregiver.  · Sleep with a pillow between your legs on your most comfortable side.  · Record how often you have hip pain, the location of the pain, and what it feels like. This information may be helpful to you and your caregiver.  · Ask your caregiver about returning to work or sports and whether you should drive.  · Follow up with your caregiver for further exams, therapy, or testing as directed.  SEEK MEDICAL CARE IF:   · Your pain or swelling continues or worsens after 1 week.  · You are feeling unwell or have chills.  · You have increasing difficulty with walking.  · You have a loss of sensation or other new symptoms.  · You have questions or concerns.  SEEK   IMMEDIATE MEDICAL CARE IF:   · You cannot put weight on the affected hip.  · You have fallen.  · You have a sudden increase in pain and swelling in your hip.  · You have a fever.  MAKE SURE YOU:   · Understand these instructions.  · Will watch your condition.  · Will get help right away if you are not doing well or get worse.  Document Released: 12/15/2009 Document Revised: 09/19/2011 Document Reviewed: 12/15/2009  ExitCare® Patient Information ©2014 [employer].

## 2013-01-24 NOTE — Patient Instructions (Signed)
Calorie Counting Diet A calorie counting diet requires you to eat the number of calories that are right for you in a day. Calories are the measurement of how much energy you get from the food you eat. Eating the right amount of calories is important for staying at a healthy weight. If you eat too many calories, your body will store them as fat and you may gain weight. If you eat too few calories, you may lose weight. Counting the number of calories you eat during a day will help you know if you are eating the right amount. A Registered Dietitian can determine how many calories you need in a day. The amount of calories needed varies from person to person. If your goal is to lose weight, you will need to eat fewer calories. Losing weight can benefit you if you are overweight or have health problems such as heart disease, high blood pressure, or diabetes. If your goal is to gain weight, you will need to eat more calories. Gaining weight may be necessary if you have a certain health problem that causes your body to need more energy. TIPS Whether you are increasing or decreasing the number of calories you eat during a day, it may be hard to get used to changes in what you eat and drink. The following are tips to help you keep track of the number of calories you eat.  Measure foods at home with measuring cups. This helps you know the amount of food and number of calories you are eating.  Restaurants often serve food in amounts that are larger than 1 serving. While eating out, estimate how many servings of a food you are given. For example, a serving of cooked rice is  cup or about the size of half of a fist. Knowing serving sizes will help you be aware of how much food you are eating at restaurants.  Ask for smaller portion sizes or child-size portions at restaurants.  Plan to eat half of a meal at a restaurant. Take the rest home or share the other half with a friend.  Read the Nutrition Facts panel on  food labels for calorie content and serving size. You can find out how many servings are in a package, the size of a serving, and the number of calories each serving has.  For example, a package might contain 3 cookies. The Nutrition Facts panel on that package says that 1 serving is 1 cookie. Below that, it will say there are 3 servings in the container. The calories section of the Nutrition Facts label says there are 90 calories. This means there are 90 calories in 1 cookie (1 serving). If you eat 1 cookie you have eaten 90 calories. If you eat all 3 cookies, you have eaten 270 calories (3 servings x 90 calories = 270 calories). The list below tells you how big or small some common portion sizes are.  1 oz.........4 stacked dice.  3 oz.........Deck of cards.  1 tsp........Tip of little finger.  1 tbs........Thumb.  2 tbs........Golf ball.   cup.......Half of a fist.  1 cup........A fist. KEEP A FOOD LOG Write down every food item you eat, the amount you eat, and the number of calories in each food you eat during the day. At the end of the day, you can add up the total number of calories you have eaten. It may help to keep a list like the one below. Find out the calorie information by reading the   Nutrition Facts panel on food labels. Breakfast  Bran cereal (1 cup, 110 calories).  Fat-free milk ( cup, 45 calories). Snack  Apple (1 medium, 80 calories). Lunch  Spinach (1 cup, 20 calories).  Tomato ( medium, 20 calories).  Chicken breast strips (3 oz, 165 calories).  Shredded cheddar cheese ( cup, 110 calories).  Light Italian dressing (2 tbs, 60 calories).  Whole-wheat bread (1 slice, 80 calories).  Tub margarine (1 tsp, 35 calories).  Vegetable soup (1 cup, 160 calories). Dinner  Pork chop (3 oz, 190 calories).  Brown rice (1 cup, 215 calories).  Steamed broccoli ( cup, 20 calories).  Strawberries (1  cup, 65 calories).  Whipped cream (1 tbs, 50  calories). Daily Calorie Total: 1425 Document Released: 06/27/2005 Document Revised: 09/19/2011 Document Reviewed: 12/22/2006 ExitCare Patient Information 2014 [employer].  

## 2013-01-31 ENCOUNTER — Telehealth: Payer: Self-pay | Admitting: Family Medicine

## 2013-02-01 ENCOUNTER — Telehealth: Payer: Self-pay | Admitting: General Practice

## 2013-02-20 ENCOUNTER — Telehealth: Payer: Self-pay | Admitting: Family Medicine

## 2013-02-21 ENCOUNTER — Other Ambulatory Visit: Payer: Self-pay | Admitting: Family Medicine

## 2013-02-21 NOTE — Telephone Encounter (Signed)
Cannot find it on the computer so will give her rx so need to come and p/u

## 2013-02-21 NOTE — Telephone Encounter (Signed)
Rx up front

## 2013-02-21 NOTE — Telephone Encounter (Signed)
Wanted a free one from here so will stop by and talk to lab

## 2013-02-22 ENCOUNTER — Other Ambulatory Visit: Payer: Self-pay

## 2013-02-22 NOTE — Telephone Encounter (Signed)
Last seen 01/24/13  B. [facility]  Last filled 12/13/12   If approved print and route to nurse

## 2013-02-25 NOTE — Telephone Encounter (Signed)
Bill to address 

## 2013-02-26 ENCOUNTER — Telehealth: Payer: Self-pay | Admitting: Family Medicine

## 2013-02-26 ENCOUNTER — Other Ambulatory Visit: Payer: Self-pay | Admitting: Family Medicine

## 2013-02-26 DIAGNOSIS — G894 Chronic pain syndrome: Secondary | ICD-10-CM

## 2013-02-26 MED ORDER — TRAMADOL HCL ER 300 MG PO TB24: 300.0000 mg | ORAL_TABLET | Freq: Every day | ORAL | Status: DC

## 2013-02-26 NOTE — Telephone Encounter (Signed)
Please have her follow up.

## 2013-02-26 NOTE — Telephone Encounter (Signed)
Daughter called and would really like it if you called and spoke with her mother she is very worried she thinks she is very depressed and just very worried they would like your resurgence

## 2013-02-26 NOTE — Telephone Encounter (Signed)
Called into [facility] 

## 2013-02-27 NOTE — Telephone Encounter (Signed)
Left message with daughter to call for follow up appt for her mom

## 2013-03-04 ENCOUNTER — Ambulatory Visit: Payer: 59 | Admitting: General Practice

## 2013-03-06 ENCOUNTER — Other Ambulatory Visit: Payer: Self-pay | Admitting: Family Medicine

## 2013-03-06 ENCOUNTER — Telehealth: Payer: Self-pay

## 2013-03-06 DIAGNOSIS — F411 Generalized anxiety disorder: Secondary | ICD-10-CM

## 2013-03-06 NOTE — Telephone Encounter (Signed)
Endocrinologist wants her to get brain levels at a psychiatrist  She wants to see Dr. Jennelle Human in [place]

## 2013-03-07 ENCOUNTER — Other Ambulatory Visit: Payer: Self-pay | Admitting: Obstetrics & Gynecology

## 2013-03-07 ENCOUNTER — Ambulatory Visit: Payer: 59 | Admitting: General Practice

## 2013-03-14 ENCOUNTER — Ambulatory Visit: Payer: 59 | Admitting: Family Medicine

## 2013-03-19 ENCOUNTER — Other Ambulatory Visit: Payer: Self-pay

## 2013-03-19 NOTE — Telephone Encounter (Signed)
Lat seen 01/26/13  B [facility] NP  Last glucose 11/16/12

## 2013-03-21 MED ORDER — METFORMIN HCL 1000 MG PO TABS: 1000.0000 mg | ORAL_TABLET | Freq: Two times a day (BID) | ORAL | Status: DC

## 2013-03-25 ENCOUNTER — Ambulatory Visit: Payer: Medicare Other | Attending: Rheumatology | Admitting: Physical Therapy

## 2013-03-25 DIAGNOSIS — M545 Low back pain, unspecified: Secondary | ICD-10-CM | POA: Insufficient documentation

## 2013-03-25 DIAGNOSIS — IMO0001 Reserved for inherently not codable concepts without codable children: Secondary | ICD-10-CM | POA: Insufficient documentation

## 2013-03-25 DIAGNOSIS — R5381 Other malaise: Secondary | ICD-10-CM | POA: Insufficient documentation

## 2013-04-02 ENCOUNTER — Ambulatory Visit: Payer: Medicare Other | Admitting: *Deleted

## 2013-04-08 ENCOUNTER — Encounter: Payer: Medicare Other | Admitting: Physical Therapy

## 2013-04-09 ENCOUNTER — Ambulatory Visit: Payer: Medicare Other | Admitting: *Deleted

## 2013-04-16 ENCOUNTER — Other Ambulatory Visit: Payer: Self-pay | Admitting: Family Medicine

## 2013-04-16 ENCOUNTER — Ambulatory Visit: Payer: Medicare Other | Attending: Rheumatology | Admitting: *Deleted

## 2013-04-16 DIAGNOSIS — R5381 Other malaise: Secondary | ICD-10-CM | POA: Insufficient documentation

## 2013-04-16 DIAGNOSIS — M545 Low back pain, unspecified: Secondary | ICD-10-CM | POA: Insufficient documentation

## 2013-04-16 DIAGNOSIS — IMO0001 Reserved for inherently not codable concepts without codable children: Secondary | ICD-10-CM | POA: Insufficient documentation

## 2013-04-19 ENCOUNTER — Encounter: Payer: Self-pay | Admitting: Family Medicine

## 2013-04-19 ENCOUNTER — Ambulatory Visit (INDEPENDENT_AMBULATORY_CARE_PROVIDER_SITE_OTHER): Payer: 59 | Admitting: Family Medicine

## 2013-04-19 VITALS — BP 144/88 | HR 81 | Temp 96.7°F | Wt 190.0 lb

## 2013-04-19 DIAGNOSIS — F411 Generalized anxiety disorder: Secondary | ICD-10-CM

## 2013-04-19 MED ORDER — VORTIOXETINE HBR 10 MG PO TABS: 10.0000 mg | ORAL_TABLET | ORAL | Status: DC

## 2013-04-19 MED ORDER — ALPRAZOLAM 0.5 MG PO TABS: 0.5000 mg | ORAL_TABLET | Freq: Every evening | ORAL | Status: DC | PRN

## 2013-04-19 NOTE — Progress Notes (Signed)
  Subjective:    Patient ID: Carla Watkins, female    DOB: 1946-05-20, 67 y.o.   MRN: [id number]  HPI This 67 y.o. female presents for evaluation for anxiety.  She has new Diagnosis of oral cancer and is scheduled for surgery, chemo,and radiation By her ENT.  She is going to get surgery next week.   Review of Systems C/o anxiety and depression sx's No chest pain, SOB, HA, dizziness, vision change, N/V, diarrhea, constipation, dysuria, urinary urgency or frequency, myalgias, arthralgias or rash.     Objective:   Physical Exam  Vital signs noted  Well developed well nourished  female.  HEENT - Head atraumatic Normocephalic                Eyes - PERRLA, Conjuctiva - clear Sclera- Clear EOMI                Ears - EAC's Wnl TM's Wnl Gross Hearing WNL                Nose - Nares patent                 Throat - oropharanx wnl Respiratory - Lungs CTA bilateral Cardiac - RRR S1 and S2 without murmur GI - Abdomen soft Nontender and bowel sounds active x 4 Extremities - No edema. Neuro - Grossly intact.      Assessment & Plan:  Generalized anxiety disorder - Plan: Vortioxetine HBr (BRINTELLIX) 10 MG TABS, ALPRAZolam (XANAX) 0.5 MG tablet Discussed she take the xanax 1/2 po bid for the next 2 weeks and then prn. Brintellix 10mg  #21 samples  Deatra Canter FNP

## 2013-04-19 NOTE — Patient Instructions (Signed)

## 2013-05-14 ENCOUNTER — Other Ambulatory Visit: Payer: Self-pay | Admitting: Obstetrics & Gynecology

## 2013-05-15 ENCOUNTER — Other Ambulatory Visit: Payer: Self-pay

## 2013-05-15 DIAGNOSIS — E785 Hyperlipidemia, unspecified: Secondary | ICD-10-CM

## 2013-05-15 MED ORDER — PRAVASTATIN SODIUM 10 MG PO TABS: 10.0000 mg | ORAL_TABLET | Freq: Every day | ORAL | Status: DC

## 2013-06-16 ENCOUNTER — Other Ambulatory Visit: Payer: Self-pay | Admitting: Family Medicine

## 2013-06-18 ENCOUNTER — Other Ambulatory Visit: Payer: Self-pay | Admitting: Family Medicine

## 2013-06-18 ENCOUNTER — Other Ambulatory Visit: Payer: Self-pay | Admitting: *Deleted

## 2013-06-18 ENCOUNTER — Telehealth: Payer: Self-pay | Admitting: Family Medicine

## 2013-06-18 DIAGNOSIS — E785 Hyperlipidemia, unspecified: Secondary | ICD-10-CM

## 2013-06-18 MED ORDER — PRAVASTATIN SODIUM 10 MG PO TABS: 10.0000 mg | ORAL_TABLET | Freq: Every day | ORAL | Status: DC

## 2013-06-18 MED ORDER — AZITHROMYCIN 250 MG PO TABS: ORAL_TABLET | ORAL | Status: DC

## 2013-06-18 NOTE — Telephone Encounter (Signed)
Please review and advise.

## 2013-06-19 ENCOUNTER — Other Ambulatory Visit: Payer: Self-pay | Admitting: Family Medicine

## 2013-06-19 ENCOUNTER — Telehealth: Payer: Self-pay | Admitting: Family Medicine

## 2013-06-19 ENCOUNTER — Other Ambulatory Visit: Payer: Self-pay | Admitting: *Deleted

## 2013-06-19 MED ORDER — METFORMIN HCL 1000 MG PO TABS: 1000.0000 mg | ORAL_TABLET | Freq: Two times a day (BID) | ORAL | Status: DC

## 2013-06-19 NOTE — Telephone Encounter (Signed)
zpak sent in yesterday

## 2013-06-20 NOTE — Telephone Encounter (Signed)
Pt aware.

## 2013-06-20 NOTE — Telephone Encounter (Signed)
Pt aware and has gotten rx already

## 2013-06-21 ENCOUNTER — Other Ambulatory Visit: Payer: Self-pay

## 2013-06-21 ENCOUNTER — Telehealth: Payer: Self-pay | Admitting: Family Medicine

## 2013-06-21 MED ORDER — GLIMEPIRIDE 2 MG PO TABS: 2.0000 mg | ORAL_TABLET | Freq: Every day | ORAL | Status: DC

## 2013-06-25 MED ORDER — AMLODIPINE-VALSARTAN-HCTZ 10-320-25 MG PO TABS: ORAL_TABLET | ORAL | Status: DC

## 2013-06-25 NOTE — Telephone Encounter (Signed)
done

## 2013-07-01 ENCOUNTER — Telehealth: Payer: Self-pay | Admitting: *Deleted

## 2013-07-01 ENCOUNTER — Other Ambulatory Visit: Payer: Self-pay | Admitting: General Practice

## 2013-07-01 DIAGNOSIS — R062 Wheezing: Secondary | ICD-10-CM

## 2013-07-01 MED ORDER — ALBUTEROL SULFATE HFA 108 (90 BASE) MCG/ACT IN AERS: 2.0000 | INHALATION_SPRAY | Freq: Four times a day (QID) | RESPIRATORY_TRACT | Status: AC | PRN

## 2013-07-01 NOTE — Telephone Encounter (Signed)
Script sent to pharmacy.

## 2013-07-01 NOTE — Telephone Encounter (Signed)
Pt needs albuteral inhaler called into [facility] [place] [phone]. She needs this asap. She has neck cancer and is staying with her daughter.

## 2013-08-26 ENCOUNTER — Other Ambulatory Visit: Payer: Self-pay

## 2013-08-26 DIAGNOSIS — G894 Chronic pain syndrome: Secondary | ICD-10-CM

## 2013-08-26 NOTE — Telephone Encounter (Signed)
Last thyroid labs 07/10/12  B [facility]

## 2013-08-26 NOTE — Telephone Encounter (Signed)
Last seen 04/19/13  B [facility]   If approved print and route to nurse

## 2013-08-28 MED ORDER — LEVOTHYROXINE SODIUM 50 MCG PO TABS: 50.0000 ug | ORAL_TABLET | Freq: Every day | ORAL | Status: DC

## 2013-08-29 ENCOUNTER — Other Ambulatory Visit: Payer: Self-pay

## 2013-08-29 DIAGNOSIS — G894 Chronic pain syndrome: Secondary | ICD-10-CM

## 2013-08-29 NOTE — Telephone Encounter (Signed)
Last seen 04/19/13  B [facility]   If approved print and route to nurse

## 2013-08-29 NOTE — Telephone Encounter (Signed)
Last seen 04/19/13  B [facility]  No thyroid labs since [employer]

## 2013-08-30 ENCOUNTER — Other Ambulatory Visit: Payer: Self-pay | Admitting: *Deleted

## 2013-08-30 DIAGNOSIS — G894 Chronic pain syndrome: Secondary | ICD-10-CM

## 2013-08-30 NOTE — Telephone Encounter (Signed)
Patient last seen in office on 04-19-13. Rx last filled on 07-12-13. Please advise. If approved please route to Pool A so nurse can call patient to come and pick up

## 2013-09-16 ENCOUNTER — Telehealth: Payer: Self-pay | Admitting: Obstetrics & Gynecology

## 2013-09-16 NOTE — Telephone Encounter (Signed)
Pt states oxybutynin not working requesting a stronger dose.  Pt states living in [place] right now due to receiving cancer treatments but will be moving back to [place] this coming weekend if needs an appt.

## 2013-09-17 ENCOUNTER — Other Ambulatory Visit: Payer: Self-pay | Admitting: Obstetrics & Gynecology

## 2013-09-17 DIAGNOSIS — N3941 Urge incontinence: Secondary | ICD-10-CM

## 2013-09-17 MED ORDER — MIRABEGRON ER 50 MG PO TB24: ORAL_TABLET | ORAL | Status: DC

## 2013-09-17 NOTE — Telephone Encounter (Signed)
Pt states the ditropan xl is not helping  Will switch to myrbetriq 50

## 2013-09-20 ENCOUNTER — Telehealth: Payer: Self-pay | Admitting: Family Medicine

## 2013-09-23 NOTE — Telephone Encounter (Signed)
Dr Elonda Husky called different rx in for pt.

## 2013-09-23 NOTE — Telephone Encounter (Signed)
Left message on machine, that she would have to be seen before it could be filled

## 2013-09-24 ENCOUNTER — Ambulatory Visit: Payer: 59 | Admitting: Family Medicine

## 2013-09-24 ENCOUNTER — Telehealth: Payer: Self-pay | Admitting: Family Medicine

## 2013-09-24 NOTE — Telephone Encounter (Signed)
appt made

## 2013-09-25 ENCOUNTER — Encounter (INDEPENDENT_AMBULATORY_CARE_PROVIDER_SITE_OTHER): Payer: Self-pay

## 2013-09-25 ENCOUNTER — Encounter: Payer: Self-pay | Admitting: Family Medicine

## 2013-09-25 ENCOUNTER — Ambulatory Visit (INDEPENDENT_AMBULATORY_CARE_PROVIDER_SITE_OTHER): Payer: 59 | Admitting: Family Medicine

## 2013-09-25 VITALS — BP 136/83 | HR 97 | Temp 97.3°F | Ht 63.5 in | Wt 153.0 lb

## 2013-09-25 DIAGNOSIS — R5381 Other malaise: Secondary | ICD-10-CM

## 2013-09-25 DIAGNOSIS — R5383 Other fatigue: Secondary | ICD-10-CM

## 2013-09-25 DIAGNOSIS — E039 Hypothyroidism, unspecified: Secondary | ICD-10-CM

## 2013-09-25 DIAGNOSIS — H109 Unspecified conjunctivitis: Secondary | ICD-10-CM

## 2013-09-25 DIAGNOSIS — F411 Generalized anxiety disorder: Secondary | ICD-10-CM

## 2013-09-25 LAB — POCT CBC
Granulocyte percent: 75.1 %G (ref 37–80)
HCT, POC: 36.5 % — AB (ref 37.7–47.9)
Hemoglobin: 11.6 g/dL — AB (ref 12.2–16.2)
Lymph, poc: 1.5 (ref 0.6–3.4)
MCH, POC: 29.8 pg (ref 27–31.2)
MCHC: 31.8 g/dL (ref 31.8–35.4)
MCV: 93.7 fL (ref 80–97)
MPV: 7.3 fL (ref 0–99.8)
POC Granulocyte: 5.6 (ref 2–6.9)
POC LYMPH PERCENT: 20.6 %L (ref 10–50)
Platelet Count, POC: 325 10*3/uL (ref 142–424)
RBC: 3.9 M/uL — AB (ref 4.04–5.48)
RDW, POC: 14.4 %
WBC: 7.4 10*3/uL (ref 4.6–10.2)

## 2013-09-25 MED ORDER — VORTIOXETINE HBR 10 MG PO TABS: 10.0000 mg | ORAL_TABLET | ORAL | Status: DC

## 2013-09-25 MED ORDER — ALPRAZOLAM 0.5 MG PO TABS: 0.5000 mg | ORAL_TABLET | Freq: Every evening | ORAL | Status: DC | PRN

## 2013-09-25 MED ORDER — POLYMYXIN B-TRIMETHOPRIM 10000-0.1 UNIT/ML-% OP SOLN: 2.0000 [drp] | OPHTHALMIC | Status: DC

## 2013-09-25 NOTE — Progress Notes (Signed)
   Subjective:    Patient ID: Carla Watkins, female    DOB: February 17, 1946, 68 y.o.   MRN: [id number]  HPI This 68 y.o. female presents for evaluation of depression and needing refills.  She has been Having some eye irritation and she is having some drainage and dc in the am in both eyes. 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  She is doing Better but feeling weak.   Review of Systems No chest pain, SOB, HA, dizziness, vision change, N/V, diarrhea, constipation, dysuria, urinary urgency or frequency, myalgias, arthralgias or rash.     Objective:   Physical Exam  Vital signs noted  Well developed well nourished female.  HEENT - Head atraumatic Normocephalic                Eyes - PERRLA, Conjuctiva - clear Sclera- Clear EOMI                Ears - EAC's Wnl TM's Wnl Gross Hearing WNL                Nose - Nares patent                 Throat - oropharanx wnl Respiratory - Lungs CTA bilateral Cardiac - RRR S1 and S2 without murmur GI - Abdomen soft Nontender and bowel sounds active x 4 Extremities - No edema. Neuro - Grossly intact.      Assessment & Plan:  Generalized anxiety disorder - Plan: Vortioxetine HBr (BRINTELLIX) 10 MG TABS, ALPRAZolam (XANAX) 0.5 MG tablet  Other malaise and fatigue - Plan: POCT CBC, CMP14+EGFR, Vitamin B12, Vit D  25 hydroxy (rtn osteoporosis monitoring)  Unspecified hypothyroidism - Plan: Thyroid Panel With TSH  Conjunctivitis - Plan: trimethoprim-polymyxin b (POLYTRIM) ophthalmic solution  Lysbeth Penner FNP

## 2013-09-26 LAB — CMP14+EGFR
ALT: 15 IU/L (ref 0–32)
AST: 23 IU/L (ref 0–40)
Albumin/Globulin Ratio: 1.6 (ref 1.1–2.5)
Albumin: 4 g/dL (ref 3.6–4.8)
Alkaline Phosphatase: 92 IU/L (ref 39–117)
BUN/Creatinine Ratio: 11 (ref 11–26)
BUN: 9 mg/dL (ref 8–27)
CO2: 25 mmol/L (ref 18–29)
Calcium: 9.4 mg/dL (ref 8.7–10.3)
Chloride: 96 mmol/L — ABNORMAL LOW (ref 97–108)
Creatinine, Ser: 0.84 mg/dL (ref 0.57–1.00)
GFR calc Af Amer: 84 mL/min/{1.73_m2} (ref >59)
GFR calc non Af Amer: 73 mL/min/{1.73_m2} (ref >59)
Globulin, Total: 2.5 g/dL (ref 1.5–4.5)
Glucose: 114 mg/dL — ABNORMAL HIGH (ref 65–99)
Potassium: 3.1 mmol/L — ABNORMAL LOW (ref 3.5–5.2)
Sodium: 142 mmol/L (ref 134–144)
Total Bilirubin: 0.3 mg/dL (ref 0.0–1.2)
Total Protein: 6.5 g/dL (ref 6.0–8.5)

## 2013-09-26 LAB — THYROID PANEL WITH TSH
Free Thyroxine Index: 3.5 (ref 1.2–4.9)
T3 Uptake Ratio: 25 % (ref 24–39)
T4, Total: 14 ug/dL — ABNORMAL HIGH (ref 4.5–12.0)
TSH: 1.14 u[IU]/mL (ref 0.450–4.500)

## 2013-09-26 LAB — VITAMIN D 25 HYDROXY (VIT D DEFICIENCY, FRACTURES): Vit D, 25-Hydroxy: 45.8 ng/mL (ref 30.0–100.0)

## 2013-09-26 LAB — VITAMIN B12: Vitamin B-12: 771 pg/mL (ref 211–946)

## 2013-10-07 ENCOUNTER — Ambulatory Visit: Payer: Medicare Other | Admitting: Physical Therapy

## 2013-10-08 ENCOUNTER — Telehealth: Payer: Self-pay | Admitting: Family Medicine

## 2013-10-14 ENCOUNTER — Other Ambulatory Visit: Payer: Self-pay | Admitting: Family Medicine

## 2013-10-14 NOTE — Telephone Encounter (Signed)
Her meds were changed and she doesn't need these meds

## 2013-10-15 NOTE — Telephone Encounter (Signed)
Left message on pharmacy voice mail

## 2013-10-16 ENCOUNTER — Telehealth: Payer: Self-pay | Admitting: Family Medicine

## 2013-10-16 ENCOUNTER — Other Ambulatory Visit: Payer: Self-pay | Admitting: *Deleted

## 2013-10-16 DIAGNOSIS — E785 Hyperlipidemia, unspecified: Secondary | ICD-10-CM

## 2013-10-16 DIAGNOSIS — G894 Chronic pain syndrome: Secondary | ICD-10-CM

## 2013-10-16 NOTE — Telephone Encounter (Signed)
Last lipo 5/14. If tramadol aprroved please print for pt to pickup.

## 2013-10-17 ENCOUNTER — Other Ambulatory Visit: Payer: Self-pay | Admitting: Family Medicine

## 2013-10-17 DIAGNOSIS — G894 Chronic pain syndrome: Secondary | ICD-10-CM

## 2013-10-17 MED ORDER — PRAVASTATIN SODIUM 10 MG PO TABS: 10.0000 mg | ORAL_TABLET | Freq: Every day | ORAL | Status: DC

## 2013-10-17 MED ORDER — TRAMADOL HCL ER 300 MG PO TB24: 300.0000 mg | ORAL_TABLET | Freq: Every day | ORAL | Status: DC

## 2013-10-17 MED ORDER — ESOMEPRAZOLE MAGNESIUM 40 MG PO CPDR
DELAYED_RELEASE_CAPSULE | ORAL | Status: DC
Start: ? — End: 2014-01-13

## 2013-10-17 NOTE — Telephone Encounter (Signed)
Duplicate message. 

## 2013-10-18 ENCOUNTER — Other Ambulatory Visit: Payer: Self-pay | Admitting: Family Medicine

## 2013-10-18 ENCOUNTER — Encounter: Payer: Self-pay | Admitting: Family Medicine

## 2013-10-18 ENCOUNTER — Telehealth: Payer: Self-pay | Admitting: Family Medicine

## 2013-10-18 ENCOUNTER — Ambulatory Visit (INDEPENDENT_AMBULATORY_CARE_PROVIDER_SITE_OTHER): Payer: 59 | Admitting: Family Medicine

## 2013-10-18 VITALS — BP 154/85 | HR 113 | Temp 97.2°F | Ht 63.5 in | Wt 154.2 lb

## 2013-10-18 DIAGNOSIS — F411 Generalized anxiety disorder: Secondary | ICD-10-CM

## 2013-10-18 DIAGNOSIS — R300 Dysuria: Secondary | ICD-10-CM

## 2013-10-18 DIAGNOSIS — R739 Hyperglycemia, unspecified: Secondary | ICD-10-CM

## 2013-10-18 DIAGNOSIS — R5383 Other fatigue: Secondary | ICD-10-CM

## 2013-10-18 DIAGNOSIS — R5381 Other malaise: Secondary | ICD-10-CM

## 2013-10-18 DIAGNOSIS — R7309 Other abnormal glucose: Secondary | ICD-10-CM

## 2013-10-18 DIAGNOSIS — I10 Essential (primary) hypertension: Secondary | ICD-10-CM

## 2013-10-18 DIAGNOSIS — R350 Frequency of micturition: Secondary | ICD-10-CM

## 2013-10-18 DIAGNOSIS — E785 Hyperlipidemia, unspecified: Secondary | ICD-10-CM

## 2013-10-18 DIAGNOSIS — Z202 Contact with and (suspected) exposure to infections with a predominantly sexual mode of transmission: Secondary | ICD-10-CM

## 2013-10-18 LAB — POCT URINALYSIS DIPSTICK

## 2013-10-18 LAB — POCT UA - MICROSCOPIC ONLY
Casts, Ur, LPF, POC: NEGATIVE
Crystals, Ur, HPF, POC: NEGATIVE
Mucus, UA: NEGATIVE
Yeast, UA: NEGATIVE

## 2013-10-18 MED ORDER — BUSPIRONE HCL 10 MG PO TABS: 10.0000 mg | ORAL_TABLET | Freq: Three times a day (TID) | ORAL | Status: DC

## 2013-10-18 MED ORDER — CIPROFLOXACIN HCL 500 MG PO TABS: 500.0000 mg | ORAL_TABLET | Freq: Two times a day (BID) | ORAL | Status: DC

## 2013-10-18 MED ORDER — AMLODIPINE BESYLATE 5 MG PO TABS: 5.0000 mg | ORAL_TABLET | Freq: Every day | ORAL | Status: DC

## 2013-10-18 MED ORDER — AZITHROMYCIN 250 MG PO TABS: ORAL_TABLET | ORAL | Status: DC

## 2013-10-18 NOTE — Telephone Encounter (Signed)
Patient was taken off of blood pressure and blood sugar medications and she thinks she may need to restart them. Appt scheduled. Patient aware.

## 2013-10-18 NOTE — Progress Notes (Signed)
Subjective:    Patient ID: Carla Watkins, female    DOB: 04-26-1946, 68 y.o.   MRN: [id number]  HPI This 68 y.o. female presents for evaluation of UA sx's.  She has been having anxiety and depression. She has been worried about cleaning her mothers house.  She has been having some pain in her back and she has picked up her tramadol.  She has been having elevated bp and would like to get back on her bp meds. Her blood sugar has been elevated. She has been worried about having an STD due To her ex husband cheating on her and she wants repeat STD.   Review of Systems    No chest pain, SOB, HA, dizziness, vision change, N/V, diarrhea, constipation, dysuria, urinary urgency or frequency, myalgias, arthralgias or rash.  Objective:   Physical Exam  Vital signs noted  Well developed well nourished female.  HEENT - Head atraumatic Normocephalic                Eyes - PERRLA, Conjuctiva - clear Sclera- Clear EOMI                Ears - EAC's Wnl TM's Wnl Gross Hearing WNL                Nose - Nares patent                 Throat - oropharanx wnl Respiratory - Lungs CTA bilateral Cardiac - RRR S1 and S2 without murmur GI - Abdomen soft Nontender and bowel sounds active x 4 Extremities - No edema. Neuro - Grossly intact.      Results for orders placed in visit on 10/18/13  POCT UA - MICROSCOPIC ONLY      Result Value Ref Range   WBC, Ur, HPF, POC 10-15     RBC, urine, microscopic rare     Bacteria, U Microscopic rare     Mucus, UA neg     Epithelial cells, urine per micros occ     Crystals, Ur, HPF, POC neg     Casts, Ur, LPF, POC neg     Yeast, UA neg    POCT URINALYSIS DIPSTICK      Result Value Ref Range   Color, UA color interference     Clarity, UA color interference     Glucose, UA color interference     Bilirubin, UA color interference     Ketones, UA color interference     Spec Grav, UA       Blood, UA color interference     pH, UA       Protein, UA color  interference     Urobilinogen, UA       Nitrite, UA color interference     Leukocytes, UA      a Assessment & Plan:  Urinary frequency - Plan: POCT UA - Microscopic Only, POCT urinalysis dipstick, ciprofloxacin (CIPRO) 500 MG tablet, amLODipine (NORVASC) 5 MG tablet  Burning with urination - Plan: POCT UA - Microscopic Only, POCT urinalysis dipstick, ciprofloxacin (CIPRO) 500 MG tablet, amLODipine (NORVASC) 5 MG tablet  Possible exposure to STD - Plan: HIV antibody, GC/Chlamydia Probe Amp  Essential hypertension, benign  Other malaise and fatigue - Plan: POCT CBC, CMP14+EGFR, Thyroid Panel With TSH, Vit D  25 hydroxy (rtn osteoporosis monitoring), Vitamin B12  Other and unspecified hyperlipidemia - Plan: Lipid panel, Magnesium  Anxiety state, unspecified - Plan: busPIRone (BUSPAR) 10 MG tablet  Hyperglycemia - Plan: POCT glycosylated hemoglobin (Hb A1C)  Lysbeth Penner FNP

## 2013-10-18 NOTE — Telephone Encounter (Signed)
I filled this yesterday and she needs to come p/u her tramadol

## 2013-10-22 ENCOUNTER — Other Ambulatory Visit (INDEPENDENT_AMBULATORY_CARE_PROVIDER_SITE_OTHER): Payer: 59

## 2013-10-22 DIAGNOSIS — E785 Hyperlipidemia, unspecified: Secondary | ICD-10-CM

## 2013-10-22 DIAGNOSIS — R5381 Other malaise: Secondary | ICD-10-CM

## 2013-10-22 DIAGNOSIS — R5383 Other fatigue: Principal | ICD-10-CM

## 2013-10-22 DIAGNOSIS — Z202 Contact with and (suspected) exposure to infections with a predominantly sexual mode of transmission: Secondary | ICD-10-CM

## 2013-10-22 NOTE — Progress Notes (Signed)
Pt came in for lab  only 

## 2013-10-23 LAB — HIV ANTIBODY (ROUTINE TESTING W REFLEX)
HIV 1/O/2 Abs-Index Value: <1 (ref <1.00)
HIV-1/HIV-2 Ab: NONREACTIVE

## 2013-10-23 LAB — VITAMIN B12: Vitamin B-12: 558 pg/mL (ref 211–946)

## 2013-10-23 LAB — LIPID PANEL
Chol/HDL Ratio: 2.5 ratio units (ref 0.0–4.4)
Cholesterol, Total: 178 mg/dL (ref 100–199)
HDL: 72 mg/dL (ref >39)
LDL Calculated: 77 mg/dL (ref 0–99)
Triglycerides: 143 mg/dL (ref 0–149)
VLDL Cholesterol Cal: 29 mg/dL (ref 5–40)

## 2013-10-23 LAB — VITAMIN D 25 HYDROXY (VIT D DEFICIENCY, FRACTURES): Vit D, 25-Hydroxy: 42 ng/mL (ref 30.0–100.0)

## 2013-10-25 ENCOUNTER — Other Ambulatory Visit: Payer: Self-pay | Admitting: *Deleted

## 2013-10-25 LAB — GC/CHLAMYDIA PROBE AMP
Chlamydia trachomatis, NAA: NEGATIVE
Neisseria gonorrhoeae by PCR: NEGATIVE

## 2013-10-25 MED ORDER — FREESTYLE LANCETS MISC: Status: DC

## 2013-10-25 MED ORDER — GLUCOSE BLOOD VI STRP: ORAL_STRIP | Status: DC

## 2013-10-29 ENCOUNTER — Telehealth: Payer: Self-pay | Admitting: Family Medicine

## 2013-10-29 NOTE — Telephone Encounter (Signed)
Pt notified of lab results Verbalizes understanding 

## 2013-11-01 ENCOUNTER — Telehealth: Payer: Self-pay | Admitting: Family Medicine

## 2013-11-01 NOTE — Telephone Encounter (Signed)
Up front patient aware 

## 2013-11-05 ENCOUNTER — Telehealth: Payer: Self-pay | Admitting: Family Medicine

## 2013-11-06 NOTE — Telephone Encounter (Signed)
For some reason the thyroid panel did not get run. The thyroid panel in March showed mildly elevated T4  But Normal TSH.  She can come in for a repeat Thyroid panel

## 2013-11-06 NOTE — Telephone Encounter (Signed)
April can you please check and see what happened with the thyroid panel?

## 2013-11-08 NOTE — Telephone Encounter (Signed)
Patient aware to come back in and get it repeated

## 2013-11-12 ENCOUNTER — Other Ambulatory Visit (INDEPENDENT_AMBULATORY_CARE_PROVIDER_SITE_OTHER): Payer: 59

## 2013-11-12 DIAGNOSIS — R5381 Other malaise: Secondary | ICD-10-CM

## 2013-11-12 DIAGNOSIS — R5383 Other fatigue: Secondary | ICD-10-CM

## 2013-11-12 LAB — POCT CBC
Granulocyte percent: 76.1 %G (ref 37–80)
HCT, POC: 36.9 % — AB (ref 37.7–47.9)
Hemoglobin: 11.9 g/dL — AB (ref 12.2–16.2)
Lymph, poc: 1.4 (ref 0.6–3.4)
MCH, POC: 28 pg (ref 27–31.2)
MCHC: 32.2 g/dL (ref 31.8–35.4)
MCV: 86.9 fL (ref 80–97)
MPV: 8.4 fL (ref 0–99.8)
POC Granulocyte: 4.9 (ref 2–6.9)
POC LYMPH PERCENT: 21.6 %L (ref 10–50)
Platelet Count, POC: 267 10*3/uL (ref 142–424)
RBC: 4.3 M/uL (ref 4.04–5.48)
RDW, POC: 13.2 %
WBC: 6.4 10*3/uL (ref 4.6–10.2)

## 2013-11-12 LAB — POCT GLYCOSYLATED HEMOGLOBIN (HGB A1C): Hemoglobin A1C: 6

## 2013-11-12 NOTE — Progress Notes (Signed)
Pt came in for labs only 

## 2013-11-12 NOTE — Progress Notes (Signed)
Came in for labs from last visit with [facility]

## 2013-11-13 LAB — CMP14+EGFR
ALT: 15 IU/L (ref 0–32)
AST: 18 IU/L (ref 0–40)
Albumin/Globulin Ratio: 1.9 (ref 1.1–2.5)
Albumin: 4.1 g/dL (ref 3.6–4.8)
Alkaline Phosphatase: 114 IU/L (ref 39–117)
BUN/Creatinine Ratio: 20 (ref 11–26)
BUN: 20 mg/dL (ref 8–27)
CO2: 27 mmol/L (ref 18–29)
Calcium: 9.7 mg/dL (ref 8.7–10.3)
Chloride: 99 mmol/L (ref 97–108)
Creatinine, Ser: 1.01 mg/dL — ABNORMAL HIGH (ref 0.57–1.00)
GFR calc Af Amer: 67 mL/min/{1.73_m2} (ref >59)
GFR calc non Af Amer: 58 mL/min/{1.73_m2} — ABNORMAL LOW (ref >59)
Globulin, Total: 2.2 g/dL (ref 1.5–4.5)
Glucose: 110 mg/dL — ABNORMAL HIGH (ref 65–99)
Potassium: 3.6 mmol/L (ref 3.5–5.2)
Sodium: 142 mmol/L (ref 134–144)
Total Bilirubin: 0.3 mg/dL (ref 0.0–1.2)
Total Protein: 6.3 g/dL (ref 6.0–8.5)

## 2013-11-13 LAB — THYROID PANEL WITH TSH
Free Thyroxine Index: 3.2 (ref 1.2–4.9)
T3 Uptake Ratio: 26 % (ref 24–39)
T4, Total: 12.4 ug/dL — ABNORMAL HIGH (ref 4.5–12.0)
TSH: 1.78 u[IU]/mL (ref 0.450–4.500)

## 2013-11-13 LAB — MAGNESIUM: Magnesium: 1.9 mg/dL (ref 1.6–2.6)

## 2013-11-15 ENCOUNTER — Other Ambulatory Visit: Payer: Self-pay | Admitting: Family Medicine

## 2013-11-15 ENCOUNTER — Telehealth: Payer: Self-pay | Admitting: Family Medicine

## 2013-11-15 ENCOUNTER — Encounter: Payer: Self-pay | Admitting: Family Medicine

## 2013-11-15 ENCOUNTER — Ambulatory Visit (INDEPENDENT_AMBULATORY_CARE_PROVIDER_SITE_OTHER): Payer: 59 | Admitting: Family Medicine

## 2013-11-15 VITALS — BP 177/87 | HR 97 | Temp 98.1°F | Ht 63.5 in | Wt 148.6 lb

## 2013-11-15 DIAGNOSIS — I10 Essential (primary) hypertension: Secondary | ICD-10-CM

## 2013-11-15 MED ORDER — AMLODIPINE-VALSARTAN-HCTZ 10-320-25 MG PO TABS: 1.0000 | ORAL_TABLET | ORAL | Status: DC

## 2013-11-15 NOTE — Telephone Encounter (Signed)
Pt notifed that she needed to be seen appt scheduled

## 2013-11-15 NOTE — Progress Notes (Signed)
   Subjective:    Patient ID: Carla Watkins, female    DOB: 03-May-1946, 68 y.o.   MRN: [id number]  HPI This 67 y.o. female presents for evaluation of hypertension.  She has been noticing her bp is becoming elevated and she has been having bp readings 195/95 she states. She has been under A lot of stress.  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   Review of Systems No chest pain, SOB, HA, dizziness, vision change, N/V, diarrhea, constipation, dysuria, urinary urgency or frequency, myalgias, arthralgias or rash.     Objective:   Physical Exam Vital signs noted  Well developed well nourished female.  HEENT - Head atraumatic Normocephalic                Eyes - PERRLA, Conjuctiva - clear Sclera- Clear EOMI                Ears - EAC's Wnl TM's Wnl Gross Hearing WNL                Nose - Nares patent                 Throat - oropharanx wnl Respiratory - Lungs CTA bilateral Cardiac - RRR S1 and S2 without murmur        Assessment & Plan:  HTN (hypertension) Refill exforge and follow up in 2 weeks for recheck. Went over recent labs and recommended to stay of thyroid replacement for now.  Lysbeth Penner FNP

## 2013-11-18 ENCOUNTER — Ambulatory Visit: Payer: 59 | Admitting: Family Medicine

## 2013-11-19 ENCOUNTER — Ambulatory Visit: Payer: 59 | Admitting: Family Medicine

## 2013-12-05 ENCOUNTER — Encounter: Payer: Self-pay | Admitting: Family Medicine

## 2013-12-05 ENCOUNTER — Ambulatory Visit (INDEPENDENT_AMBULATORY_CARE_PROVIDER_SITE_OTHER): Payer: 59 | Admitting: Family Medicine

## 2013-12-05 VITALS — BP 155/89 | HR 110 | Temp 97.0°F | Ht 63.5 in | Wt 152.6 lb

## 2013-12-05 DIAGNOSIS — I10 Essential (primary) hypertension: Secondary | ICD-10-CM

## 2013-12-05 DIAGNOSIS — M6281 Muscle weakness (generalized): Secondary | ICD-10-CM

## 2013-12-05 MED ORDER — AMLODIPINE-VALSARTAN-HCTZ 10-320-25 MG PO TABS: 1.0000 | ORAL_TABLET | ORAL | Status: DC

## 2013-12-05 MED ORDER — NEBIVOLOL HCL 10 MG PO TABS: 10.0000 mg | ORAL_TABLET | Freq: Every day | ORAL | Status: AC

## 2013-12-06 NOTE — Progress Notes (Signed)
   Subjective:    Patient ID: Carla Watkins, female    DOB: 12-Apr-1946, 68 y.o.   MRN: [id number]  HPI  This 68 y.o. female presents for evaluation of hypertension.  She has been on exforge and bystolic in the past.  She is on amlodipine and losartan for hypertension and her bp is not controlled.  She has been having weakness in her legs recently.  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  She notices her strength is not good in her legs due to deconditioning.  Review of Systems No chest pain, SOB, HA, dizziness, vision change, N/V, diarrhea, constipation, dysuria, urinary urgency or frequency, myalgias, arthralgias or rash.     Objective:   Physical Exam Vital signs noted  Well developed well nourished female.  HEENT - Head atraumatic Normocephalic                Eyes - PERRLA, Conjuctiva - clear Sclera- Clear EOMI                Ears - EAC's Wnl TM's Wnl Gross Hearing WNL                Nose - Nares patent                 Throat - oropharanx wnl Respiratory - Lungs CTA bilateral Cardiac - RRR S1 and S2 without murmur GI - Abdomen soft Nontender and bowel sounds active x 4 Extremities - No edema. Neuro - Grossly intact.       Assessment & Plan:  Essential hypertension, benign - Plan: Amlodipine-Valsartan-HCTZ (EXFORGE HCT) 10-320-25 MG TABS, nebivolol (BYSTOLIC) 10 MG tablet  Decreased muscle strength - Plan: Ambulatory referral to Physical Therapy   Lysbeth Penner FNP

## 2013-12-12 ENCOUNTER — Ambulatory Visit: Payer: Medicare Other | Admitting: Physical Therapy

## 2013-12-30 ENCOUNTER — Telehealth: Payer: Self-pay | Admitting: Family Medicine

## 2013-12-31 ENCOUNTER — Other Ambulatory Visit: Payer: Self-pay | Admitting: Family Medicine

## 2013-12-31 MED ORDER — SOLIFENACIN SUCCINATE 10 MG PO TABS: 10.0000 mg | ORAL_TABLET | Freq: Every day | ORAL | Status: DC

## 2013-12-31 NOTE — Telephone Encounter (Signed)
[facility] rx sent to her pharmacy

## 2014-01-03 ENCOUNTER — Telehealth: Payer: Self-pay | Admitting: Family Medicine

## 2014-01-06 ENCOUNTER — Other Ambulatory Visit: Payer: Self-pay | Admitting: Family Medicine

## 2014-01-06 ENCOUNTER — Telehealth: Payer: Self-pay | Admitting: *Deleted

## 2014-01-06 MED ORDER — PROMETHAZINE HCL 25 MG PO TABS: 25.0000 mg | ORAL_TABLET | Freq: Three times a day (TID) | ORAL | Status: DC | PRN

## 2014-01-06 NOTE — Telephone Encounter (Signed)
Received refill request for promethazine 25mg  tabs. Take one tab po every 6-8 hrs as needed. Do not see on current med list. Please advise on refill

## 2014-01-06 NOTE — Telephone Encounter (Signed)
Promethazine sent to [facility]

## 2014-01-06 NOTE — Telephone Encounter (Signed)
Sent in phenergan rx

## 2014-01-09 ENCOUNTER — Telehealth: Payer: Self-pay | Admitting: Family Medicine

## 2014-01-13 MED ORDER — ESOMEPRAZOLE MAGNESIUM 40 MG PO CPDR: DELAYED_RELEASE_CAPSULE | ORAL | Status: DC

## 2014-01-13 NOTE — Telephone Encounter (Signed)
done

## 2014-01-14 ENCOUNTER — Ambulatory Visit: Payer: Medicare Other | Attending: Family Medicine | Admitting: Physical Therapy

## 2014-01-14 DIAGNOSIS — E119 Type 2 diabetes mellitus without complications: Secondary | ICD-10-CM | POA: Insufficient documentation

## 2014-01-14 DIAGNOSIS — I10 Essential (primary) hypertension: Secondary | ICD-10-CM | POA: Diagnosis not present

## 2014-01-14 DIAGNOSIS — Z9889 Other specified postprocedural states: Secondary | ICD-10-CM | POA: Diagnosis not present

## 2014-01-14 DIAGNOSIS — IMO0001 Reserved for inherently not codable concepts without codable children: Secondary | ICD-10-CM | POA: Insufficient documentation

## 2014-01-14 DIAGNOSIS — M6281 Muscle weakness (generalized): Secondary | ICD-10-CM | POA: Insufficient documentation

## 2014-01-14 DIAGNOSIS — M51379 Other intervertebral disc degeneration, lumbosacral region without mention of lumbar back pain or lower extremity pain: Secondary | ICD-10-CM | POA: Insufficient documentation

## 2014-01-14 DIAGNOSIS — Z923 Personal history of irradiation: Secondary | ICD-10-CM | POA: Insufficient documentation

## 2014-01-14 DIAGNOSIS — M5137 Other intervertebral disc degeneration, lumbosacral region: Secondary | ICD-10-CM | POA: Diagnosis not present

## 2014-01-14 DIAGNOSIS — Z9221 Personal history of antineoplastic chemotherapy: Secondary | ICD-10-CM | POA: Insufficient documentation

## 2014-01-14 DIAGNOSIS — C760 Malignant neoplasm of head, face and neck: Secondary | ICD-10-CM | POA: Insufficient documentation

## 2014-01-14 DIAGNOSIS — R5381 Other malaise: Secondary | ICD-10-CM | POA: Insufficient documentation

## 2014-01-21 ENCOUNTER — Ambulatory Visit: Payer: Medicare Other | Admitting: Physical Therapy

## 2014-01-21 DIAGNOSIS — IMO0001 Reserved for inherently not codable concepts without codable children: Secondary | ICD-10-CM | POA: Diagnosis not present

## 2014-01-23 ENCOUNTER — Encounter: Payer: Medicare Other | Admitting: Physical Therapy

## 2014-01-27 ENCOUNTER — Ambulatory Visit: Payer: Medicare Other | Admitting: Physical Therapy

## 2014-01-27 DIAGNOSIS — IMO0001 Reserved for inherently not codable concepts without codable children: Secondary | ICD-10-CM | POA: Diagnosis not present

## 2014-01-28 IMAGING — CR DG HIP (WITH OR WITHOUT PELVIS) 2-3V*L*
2 series · 2 of 2 positions shown · non-contrast
Comparison: None

CLINICAL DATA: Left hip pain post fall

LEFT HIP - COMPLETE 2+ VIEW

[view not recorded (1 of 2)]
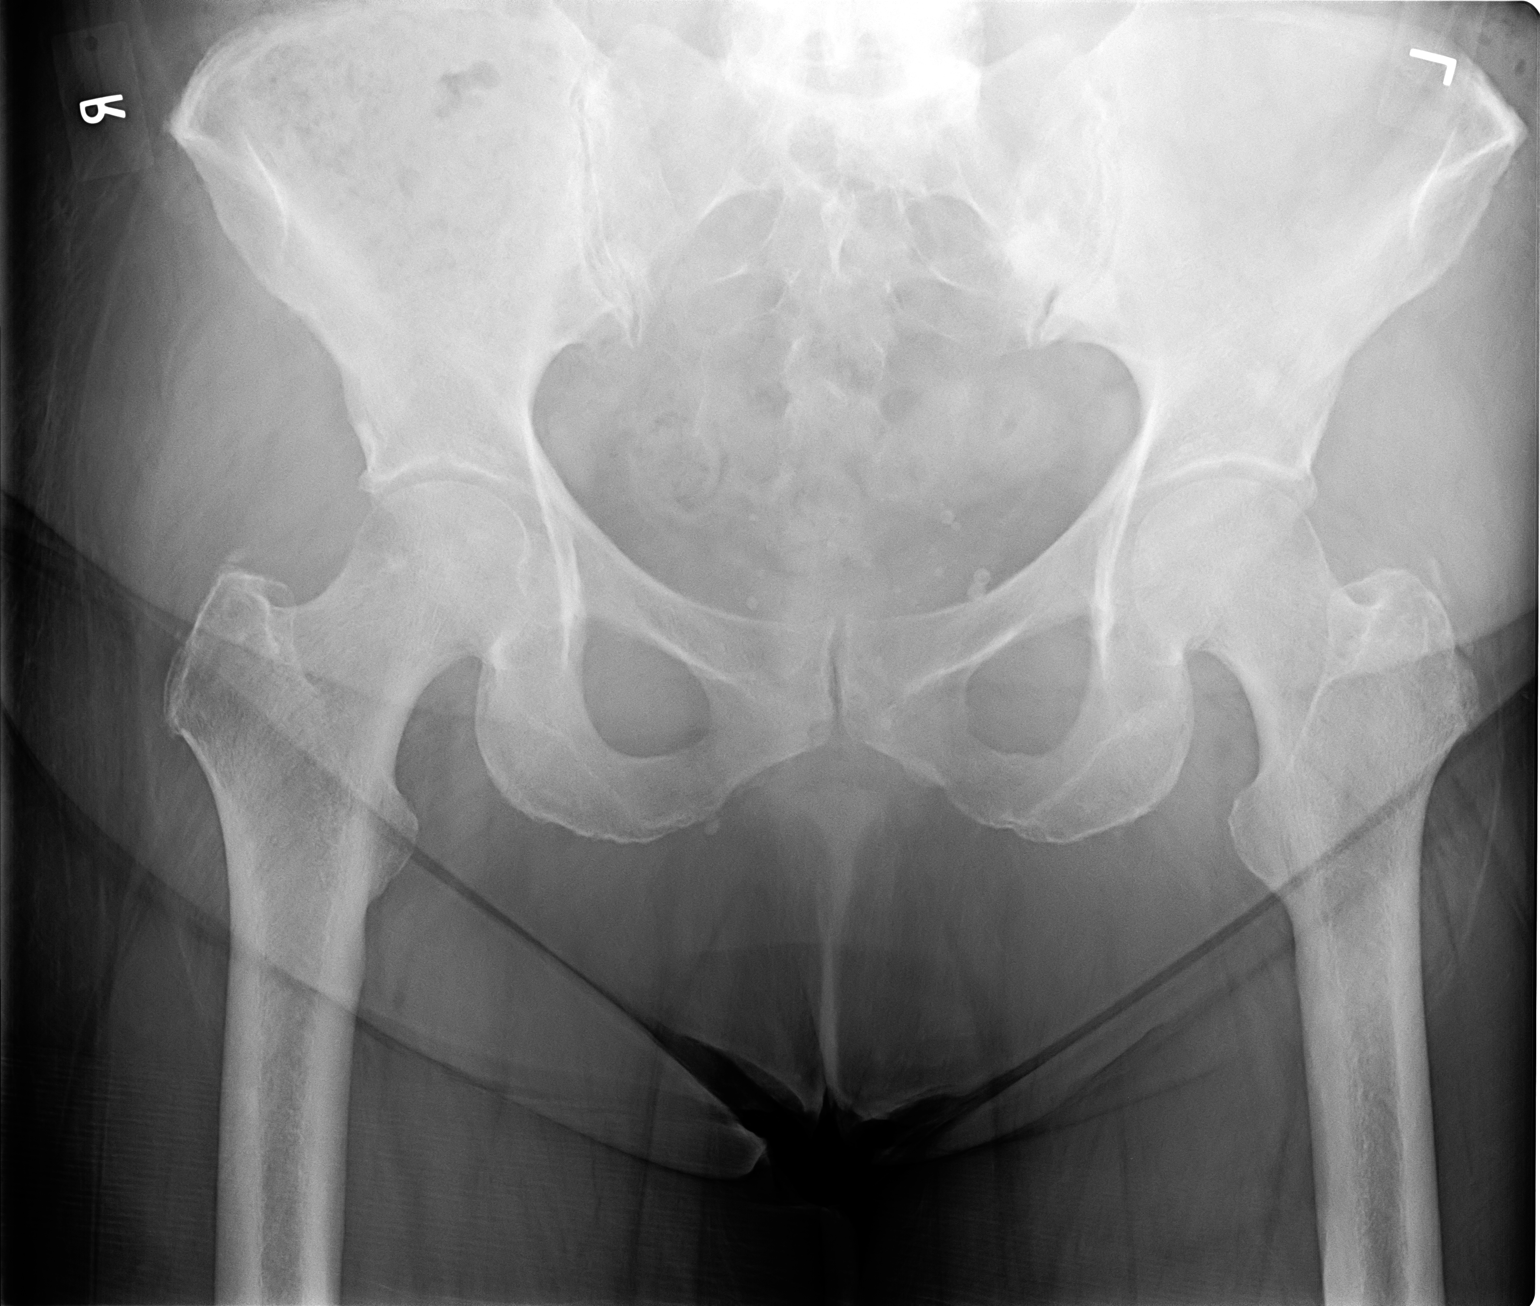

[view not recorded (2 of 2)]
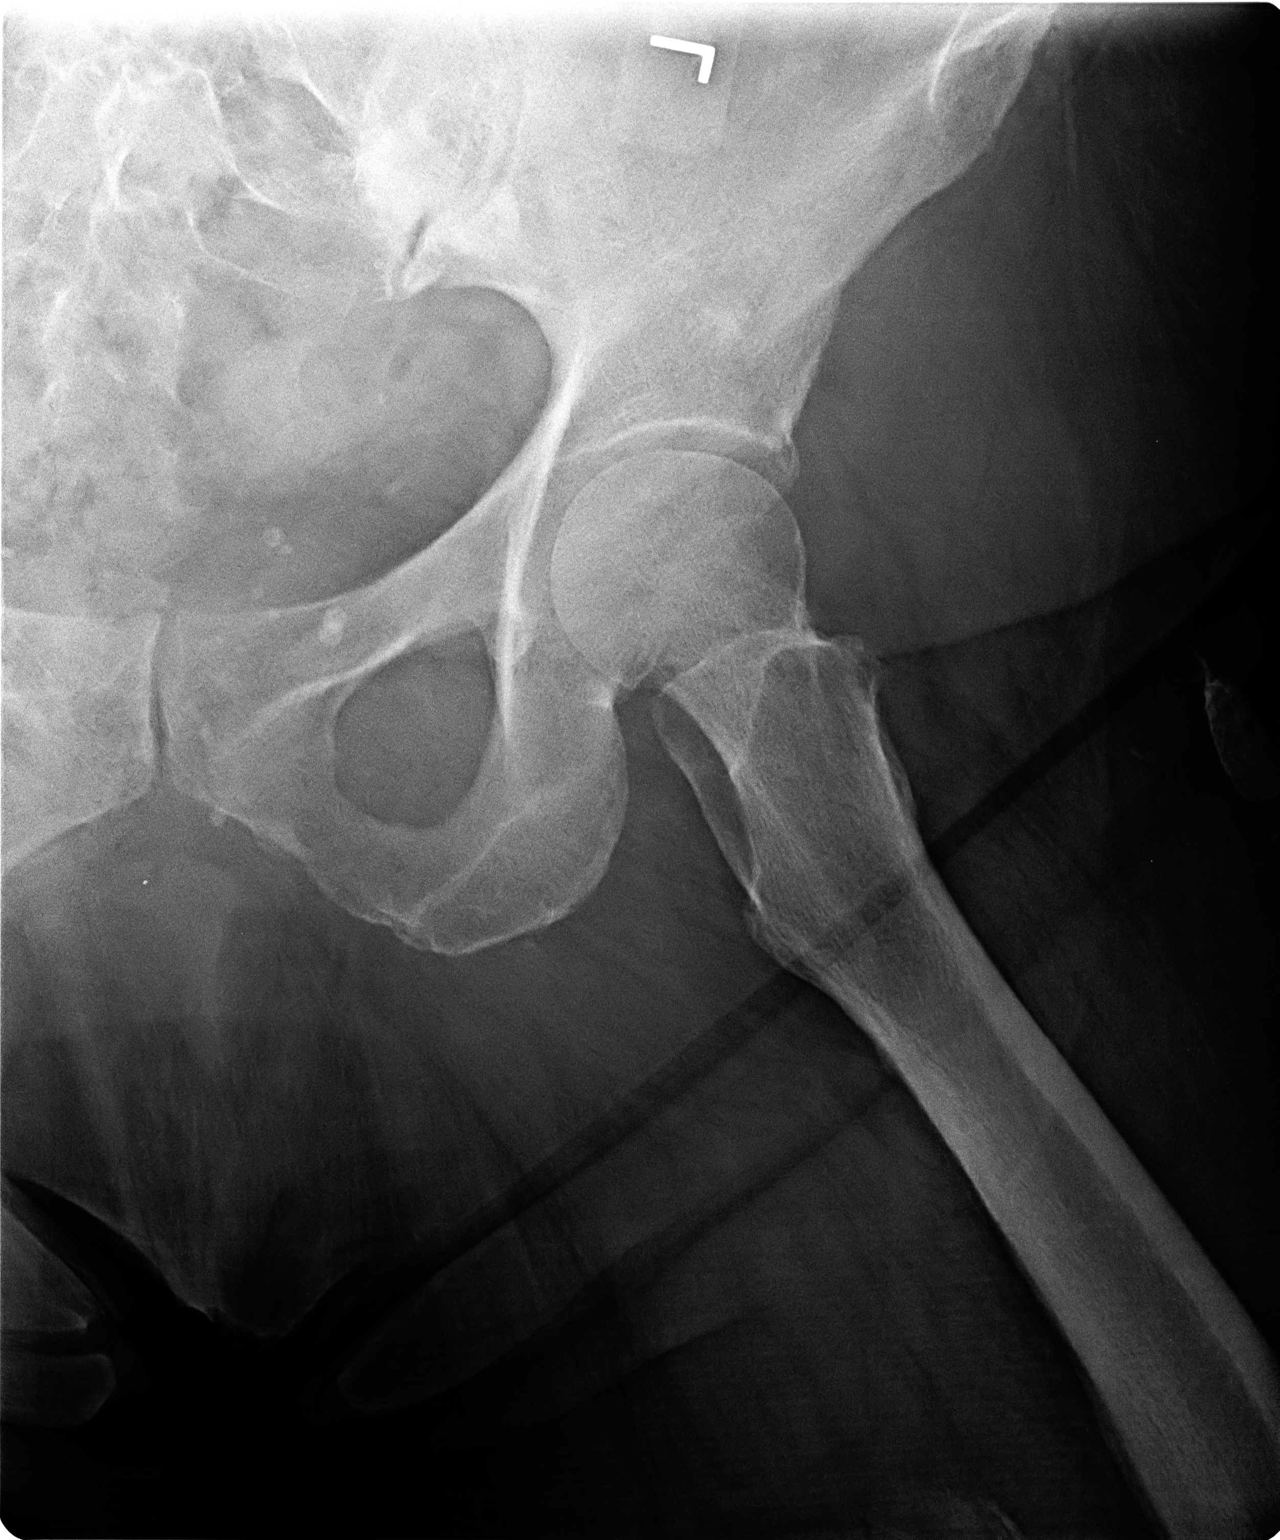

[2 of 2 positions shown; findings below may reference images not displayed]

FINDINGS: Superior aspects of iliac wings excluded.
Bones appear mildly demineralized.
Symmetric hip and SI joints.
Sclerosis identified at medial left ilium.
No acute fracture, dislocation, or bone destruction.
Scattered pelvic phleboliths.
Question additional area of vague sclerosis in the left supra
acetabular region.
IMPRESSION: No radiographic evidence of acute injury.
Sclerotic foci in left ilium, uncertain etiology and significance;
these could potentially represent bone islands though other
etiologies including blastic metastases not excluded.
If patient has any prior outside radiographic or CT exams these
would be of benefit to establish stability.
In the absence of previous studies, consider whole-body
radionuclide bone scan to exclude blastic osseous metastases.

## 2014-01-30 ENCOUNTER — Encounter: Payer: Medicare Other | Admitting: Physical Therapy

## 2014-02-11 ENCOUNTER — Encounter: Payer: Medicare Other | Admitting: Physical Therapy

## 2014-02-13 ENCOUNTER — Ambulatory Visit: Payer: Medicare Other | Attending: Family Medicine | Admitting: Physical Therapy

## 2014-02-13 DIAGNOSIS — R5381 Other malaise: Secondary | ICD-10-CM | POA: Diagnosis not present

## 2014-02-13 DIAGNOSIS — M5137 Other intervertebral disc degeneration, lumbosacral region: Secondary | ICD-10-CM | POA: Insufficient documentation

## 2014-02-13 DIAGNOSIS — IMO0001 Reserved for inherently not codable concepts without codable children: Secondary | ICD-10-CM | POA: Insufficient documentation

## 2014-02-13 DIAGNOSIS — I10 Essential (primary) hypertension: Secondary | ICD-10-CM | POA: Insufficient documentation

## 2014-02-13 DIAGNOSIS — Z9221 Personal history of antineoplastic chemotherapy: Secondary | ICD-10-CM | POA: Insufficient documentation

## 2014-02-13 DIAGNOSIS — E119 Type 2 diabetes mellitus without complications: Secondary | ICD-10-CM | POA: Insufficient documentation

## 2014-02-13 DIAGNOSIS — Z9889 Other specified postprocedural states: Secondary | ICD-10-CM | POA: Insufficient documentation

## 2014-02-13 DIAGNOSIS — C760 Malignant neoplasm of head, face and neck: Secondary | ICD-10-CM | POA: Insufficient documentation

## 2014-02-13 DIAGNOSIS — M51379 Other intervertebral disc degeneration, lumbosacral region without mention of lumbar back pain or lower extremity pain: Secondary | ICD-10-CM | POA: Insufficient documentation

## 2014-02-13 DIAGNOSIS — M6281 Muscle weakness (generalized): Secondary | ICD-10-CM | POA: Insufficient documentation

## 2014-02-13 DIAGNOSIS — Z923 Personal history of irradiation: Secondary | ICD-10-CM | POA: Insufficient documentation

## 2014-02-19 ENCOUNTER — Encounter: Payer: Medicare Other | Admitting: Physical Therapy

## 2014-02-20 ENCOUNTER — Ambulatory Visit: Payer: Medicare Other | Admitting: Physical Therapy

## 2014-02-20 DIAGNOSIS — IMO0001 Reserved for inherently not codable concepts without codable children: Secondary | ICD-10-CM | POA: Diagnosis not present

## 2014-02-24 ENCOUNTER — Ambulatory Visit: Payer: Medicare Other | Admitting: Physical Therapy

## 2014-02-24 DIAGNOSIS — IMO0001 Reserved for inherently not codable concepts without codable children: Secondary | ICD-10-CM | POA: Diagnosis not present

## 2014-02-26 ENCOUNTER — Ambulatory Visit: Payer: Medicare Other | Admitting: Physical Therapy

## 2014-02-26 DIAGNOSIS — IMO0001 Reserved for inherently not codable concepts without codable children: Secondary | ICD-10-CM | POA: Diagnosis not present

## 2014-03-03 ENCOUNTER — Other Ambulatory Visit: Payer: Self-pay | Admitting: Family Medicine

## 2014-03-04 ENCOUNTER — Other Ambulatory Visit: Payer: Self-pay | Admitting: *Deleted

## 2014-03-04 ENCOUNTER — Encounter: Payer: Medicare Other | Admitting: Physical Therapy

## 2014-03-04 DIAGNOSIS — G894 Chronic pain syndrome: Secondary | ICD-10-CM

## 2014-03-04 MED ORDER — TRAMADOL HCL ER 300 MG PO TB24: 300.0000 mg | ORAL_TABLET | Freq: Every day | ORAL | Status: DC

## 2014-03-04 NOTE — Telephone Encounter (Signed)
Patient last seen in office on 12-05-13. Please advise. If approved please print and route to [facility] so nurse can call patient to pick up

## 2014-03-06 ENCOUNTER — Ambulatory Visit: Payer: Medicare Other | Admitting: Physical Therapy

## 2014-03-06 DIAGNOSIS — IMO0001 Reserved for inherently not codable concepts without codable children: Secondary | ICD-10-CM | POA: Diagnosis not present

## 2014-03-10 ENCOUNTER — Ambulatory Visit: Payer: 59 | Admitting: Family Medicine

## 2014-03-10 ENCOUNTER — Telehealth: Payer: Self-pay | Admitting: Family Medicine

## 2014-03-10 NOTE — Telephone Encounter (Signed)
LM that Rx was here and ready

## 2014-03-11 ENCOUNTER — Ambulatory Visit: Payer: Medicare Other | Attending: Family Medicine | Admitting: Physical Therapy

## 2014-03-11 DIAGNOSIS — E119 Type 2 diabetes mellitus without complications: Secondary | ICD-10-CM | POA: Insufficient documentation

## 2014-03-11 DIAGNOSIS — M5137 Other intervertebral disc degeneration, lumbosacral region: Secondary | ICD-10-CM | POA: Insufficient documentation

## 2014-03-11 DIAGNOSIS — Z923 Personal history of irradiation: Secondary | ICD-10-CM | POA: Insufficient documentation

## 2014-03-11 DIAGNOSIS — R5381 Other malaise: Secondary | ICD-10-CM | POA: Insufficient documentation

## 2014-03-11 DIAGNOSIS — C760 Malignant neoplasm of head, face and neck: Secondary | ICD-10-CM | POA: Diagnosis not present

## 2014-03-11 DIAGNOSIS — IMO0001 Reserved for inherently not codable concepts without codable children: Secondary | ICD-10-CM | POA: Insufficient documentation

## 2014-03-11 DIAGNOSIS — Z9889 Other specified postprocedural states: Secondary | ICD-10-CM | POA: Diagnosis not present

## 2014-03-11 DIAGNOSIS — M6281 Muscle weakness (generalized): Secondary | ICD-10-CM | POA: Diagnosis not present

## 2014-03-11 DIAGNOSIS — I10 Essential (primary) hypertension: Secondary | ICD-10-CM | POA: Insufficient documentation

## 2014-03-11 DIAGNOSIS — Z9221 Personal history of antineoplastic chemotherapy: Secondary | ICD-10-CM | POA: Insufficient documentation

## 2014-03-11 DIAGNOSIS — M51379 Other intervertebral disc degeneration, lumbosacral region without mention of lumbar back pain or lower extremity pain: Secondary | ICD-10-CM | POA: Insufficient documentation

## 2014-03-18 ENCOUNTER — Encounter: Payer: Medicare Other | Admitting: Physical Therapy

## 2014-03-20 ENCOUNTER — Ambulatory Visit: Payer: Medicare Other | Admitting: Physical Therapy

## 2014-03-20 DIAGNOSIS — IMO0001 Reserved for inherently not codable concepts without codable children: Secondary | ICD-10-CM | POA: Diagnosis not present

## 2014-03-24 ENCOUNTER — Other Ambulatory Visit (INDEPENDENT_AMBULATORY_CARE_PROVIDER_SITE_OTHER): Payer: 59

## 2014-03-24 ENCOUNTER — Other Ambulatory Visit (HOSPITAL_COMMUNITY)
Admission: RE | Admit: 2014-03-24 | Discharge: 2014-03-24 | Disposition: A | Discharge status: Home / Self Care | Payer: Medicare Other | Source: Ambulatory Visit | Attending: Obstetrics & Gynecology | Admitting: Obstetrics & Gynecology

## 2014-03-24 ENCOUNTER — Ambulatory Visit: Payer: Medicare Other | Admitting: Physical Therapy

## 2014-03-24 ENCOUNTER — Ambulatory Visit (INDEPENDENT_AMBULATORY_CARE_PROVIDER_SITE_OTHER): Payer: Medicare Other | Admitting: Obstetrics & Gynecology

## 2014-03-24 ENCOUNTER — Encounter: Payer: Self-pay | Admitting: Obstetrics & Gynecology

## 2014-03-24 VITALS — BP 109/72 | HR 63 | Resp 16 | Ht 63.0 in | Wt 150.0 lb

## 2014-03-24 DIAGNOSIS — N760 Acute vaginitis: Secondary | ICD-10-CM

## 2014-03-24 DIAGNOSIS — E119 Type 2 diabetes mellitus without complications: Secondary | ICD-10-CM

## 2014-03-24 DIAGNOSIS — Z01419 Encounter for gynecological examination (general) (routine) without abnormal findings: Secondary | ICD-10-CM

## 2014-03-24 DIAGNOSIS — R5381 Other malaise: Secondary | ICD-10-CM

## 2014-03-24 DIAGNOSIS — Z124 Encounter for screening for malignant neoplasm of cervix: Secondary | ICD-10-CM | POA: Diagnosis present

## 2014-03-24 DIAGNOSIS — Z8541 Personal history of malignant neoplasm of cervix uteri: Secondary | ICD-10-CM

## 2014-03-24 DIAGNOSIS — R5383 Other fatigue: Principal | ICD-10-CM

## 2014-03-24 DIAGNOSIS — Z1272 Encounter for screening for malignant neoplasm of vagina: Secondary | ICD-10-CM

## 2014-03-24 DIAGNOSIS — R87619 Unspecified abnormal cytological findings in specimens from cervix uteri: Secondary | ICD-10-CM | POA: Insufficient documentation

## 2014-03-24 DIAGNOSIS — Z1151 Encounter for screening for human papillomavirus (HPV): Secondary | ICD-10-CM | POA: Insufficient documentation

## 2014-03-24 DIAGNOSIS — IMO0001 Reserved for inherently not codable concepts without codable children: Secondary | ICD-10-CM | POA: Diagnosis not present

## 2014-03-24 LAB — POCT CBC
Granulocyte percent: 70.5 %G (ref 37–80)
HCT, POC: 38.9 % (ref 37.7–47.9)
Hemoglobin: 12.9 g/dL (ref 12.2–16.2)
Lymph, poc: 1.8 (ref 0.6–3.4)
MCH, POC: 28.5 pg (ref 27–31.2)
MCHC: 33.1 g/dL (ref 31.8–35.4)
MCV: 86 fL (ref 80–97)
MPV: 9 fL (ref 0–99.8)
POC Granulocyte: 4.7 (ref 2–6.9)
POC LYMPH PERCENT: 26.2 %L (ref 10–50)
Platelet Count, POC: 247 10*3/uL (ref 142–424)
RBC: 4.5 M/uL (ref 4.04–5.48)
RDW, POC: 13.4 %
WBC: 6.7 10*3/uL (ref 4.6–10.2)

## 2014-03-24 LAB — POCT GLYCOSYLATED HEMOGLOBIN (HGB A1C): Hemoglobin A1C: 5.3

## 2014-03-24 MED ORDER — FLUCONAZOLE 150 MG PO TABS: 150.0000 mg | ORAL_TABLET | ORAL | Status: DC

## 2014-03-24 NOTE — Progress Notes (Signed)
Lab only 

## 2014-03-24 NOTE — Addendum Note (Signed)
Addended by: Gretchen Short on: 03/24/2014 12:02 PM   Modules accepted: Orders

## 2014-03-24 NOTE — Patient Instructions (Signed)
No tub baths for 4 weeks Keep vulva clean and DRY Soap: Dove unscented   Monilial Vaginitis Vaginitis in a soreness, swelling and redness (inflammation) of the vagina and vulva. Monilial vaginitis is not a sexually transmitted infection. CAUSES  Yeast vaginitis is caused by yeast (candida) that is normally found in your vagina. With a yeast infection, the candida has overgrown in number to a point that upsets the chemical balance. SYMPTOMS   White, thick vaginal discharge.  Swelling, itching, redness and irritation of the vagina and possibly the lips of the vagina (vulva).  Burning or painful urination.  Painful intercourse. DIAGNOSIS  Things that may contribute to monilial vaginitis are:  Postmenopausal and virginal states.  Pregnancy.  Infections.  Being tired, sick or stressed, especially if you had monilial vaginitis in the past.  Diabetes. Good control will help lower the chance.  Birth control pills.  Tight fitting garments.  Using bubble bath, feminine sprays, douches or deodorant tampons.  Taking certain medications that kill germs (antibiotics).  Sporadic recurrence can occur if you become ill. TREATMENT  Your caregiver will give you medication.  There are several kinds of anti monilial vaginal creams and suppositories specific for monilial vaginitis. For recurrent yeast infections, use a suppository or cream in the vagina 2 times a week, or as directed.  Anti-monilial or steroid cream for the itching or irritation of the vulva may also be used. Get your caregiver's permission.  Painting the vagina with methylene blue solution may help if the monilial cream does not work.  Eating yogurt may help prevent monilial vaginitis. HOME CARE INSTRUCTIONS   Finish all medication as prescribed.  Do not have sex until treatment is completed or after your caregiver tells you it is okay.  Take warm sitz baths.  Do not douche.  Do not use tampons, especially  scented ones.  Wear cotton underwear.  Avoid tight pants and panty hose.  Tell your sexual partner that you have a yeast infection. They should go to their caregiver if they have symptoms such as mild rash or itching.  Your sexual partner should be treated as well if your infection is difficult to eliminate.  Practice safer sex. Use condoms.  Some vaginal medications cause latex condoms to fail. Vaginal medications that harm condoms are:  Cleocin cream.  Butoconazole (Femstat).  Terconazole (Terazol) vaginal suppository.  Miconazole (Monistat) (may be purchased over the counter). SEEK MEDICAL CARE IF:   You have a temperature by mouth above 102 F (38.9 C).  The infection is getting worse after 2 days of treatment.  The infection is not getting better after 3 days of treatment.  You develop blisters in or around your vagina.  You develop vaginal bleeding, and it is not your menstrual period.  You have pain when you urinate.  You develop intestinal problems.  You have pain with sexual intercourse. Document Released: 04/06/2005 Document Revised: 09/19/2011 Document Reviewed: 12/19/2008 [facility] Patient Information 2015 [place], [place]. This information is not intended to replace advice given to you by your health care provider. Make sure you discuss any questions you have with your health care provider.

## 2014-03-24 NOTE — Progress Notes (Signed)
Patient ID: Carla Watkins, female   DOB: 08-Feb-1946, 68 y.o.   MRN: [id number] Subjective:     Carla Watkins is a 68 y.o. female here for a routine exam.  Current complaints: vaginal bleeding (pt brought pad in there is old blood on the pad) she reports that this occurs daily and is definitely not from the rectum but, the vagina.  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  She reports that she has lost 60+ #'s.  She reports that she was prev diabetic but, that has resolved since she has lost the weight.  She reports a h/o cervical cancer treated with surgery.  She is worried about cancer recurrence. She takes tubs baths and soaks almost daily for at least 66min.   Pt has been using a deodorant soap on the vulva. Gynecologic History No LMP recorded. Patient has had a hysterectomy. Contraception: post menopausal status Last Pap: unknown.  Last mammogram: 12/2013. Results were: abnormal.  Pt s/p multiple biopsies which she reports were all negative for cancer   Obstetric History OB History  No data available     The following portions of the patient's history were reviewed and updated as appropriate: allergies, current medications, past family history, past medical history, past social history, past surgical history and problem list.  Review of Systems Pertinent items are noted in HPI.    Objective:    BP 109/72  Pulse 63  Resp 16  Ht 5\' 3"  (1.6 m)  Wt 150 lb (68.04 kg)  BMI 26.58 kg/m2  General Appearance:    Alert, cooperative, no distress, appears older than stated age                    Back:     Symmetric, no curvature, ROM normal, no CVA tenderness  Lungs:     Clear to auscultation bilaterally, respirations unlabored  Chest Wall:    No tenderness or deformity   Heart:    Regular rate and rhythm, S1 and S2 normal, no murmur, rub   or gallop  Breast Exam:    No tenderness, masses, or nipple abnormality  Abdomen:     Soft, non-tender,  bowel sounds active all four quadrants,    no masses, no organomegaly  Genitalia:   vaginal cuff well healed no lesion noted.  PAP of cuff obtained due to pts history.  The vulva has a large area of erythema- it appears raw with some satellite lesions. I suspect that this is where the bleeding is originating from. Anus: no lesions or hemorrhoids noted.  Perineum is clear of lesions                        Assessment:    Healthy female exam.  Vulvovaginitis- suspect due to yeast.  Will treat and have pt f/u to make sure that it has resolved and is in fact the cause of the bleeding     Plan:    Follow up in: 4 weeks.  to make sure that the vulva is clear or clearing Diflucan 150mg  every other day for 3 doses Avoid tub baths for 4 weeks. May shower Use Dove unscented only soap F/u PAP

## 2014-03-25 LAB — BMP8+EGFR
BUN/Creatinine Ratio: 19 (ref 11–26)
BUN: 23 mg/dL (ref 8–27)
CO2: 26 mmol/L (ref 18–29)
Calcium: 10 mg/dL (ref 8.7–10.3)
Chloride: 97 mmol/L (ref 97–108)
Creatinine, Ser: 1.24 mg/dL — ABNORMAL HIGH (ref 0.57–1.00)
GFR calc Af Amer: 52 mL/min/{1.73_m2} — ABNORMAL LOW (ref >59)
GFR calc non Af Amer: 45 mL/min/{1.73_m2} — ABNORMAL LOW (ref >59)
Glucose: 125 mg/dL — ABNORMAL HIGH (ref 65–99)
Potassium: 3.6 mmol/L (ref 3.5–5.2)
Sodium: 139 mmol/L (ref 134–144)

## 2014-03-26 LAB — CYTOLOGY - PAP

## 2014-03-27 ENCOUNTER — Encounter: Payer: Self-pay | Admitting: Family Medicine

## 2014-03-27 ENCOUNTER — Encounter: Payer: Medicare Other | Admitting: *Deleted

## 2014-03-27 ENCOUNTER — Ambulatory Visit (INDEPENDENT_AMBULATORY_CARE_PROVIDER_SITE_OTHER): Payer: 59 | Admitting: Family Medicine

## 2014-03-27 VITALS — BP 96/66 | HR 58 | Temp 97.5°F | Ht 63.0 in | Wt 149.0 lb

## 2014-03-27 DIAGNOSIS — E119 Type 2 diabetes mellitus without complications: Secondary | ICD-10-CM

## 2014-03-27 DIAGNOSIS — C760 Malignant neoplasm of head, face and neck: Secondary | ICD-10-CM

## 2014-03-27 DIAGNOSIS — K219 Gastro-esophageal reflux disease without esophagitis: Secondary | ICD-10-CM

## 2014-03-27 DIAGNOSIS — G894 Chronic pain syndrome: Secondary | ICD-10-CM

## 2014-03-27 DIAGNOSIS — E039 Hypothyroidism, unspecified: Secondary | ICD-10-CM | POA: Insufficient documentation

## 2014-03-27 MED ORDER — ESOMEPRAZOLE MAGNESIUM 40 MG PO CPDR: DELAYED_RELEASE_CAPSULE | ORAL | Status: DC

## 2014-03-27 MED ORDER — TRAMADOL HCL ER 300 MG PO TB24: 300.0000 mg | ORAL_TABLET | Freq: Every day | ORAL | Status: DC

## 2014-03-27 NOTE — Progress Notes (Signed)
   Subjective:    Patient ID: Carla Watkins, female    DOB: February 09, 1946, 68 y.o.   MRN: [id number]  HPI This 68 y.o. female presents for evaluation of diabetes appointment.  She has hx of hypothyroidism. She has hx of GERD.  She has chronic pain from neck cancer.  She is doing good and has no acute complaints.   Review of Systems    No chest pain, SOB, HA, dizziness, vision change, N/V, diarrhea, constipation, dysuria, urinary urgency or frequency, myalgias, arthralgias or rash.  Objective:   Physical Exam  Vital signs noted  Well developed well nourished female.  HEENT - Head atraumatic Normocephalic                Eyes - PERRLA, Conjuctiva - clear Sclera- Clear EOMI                Ears - EAC's Wnl TM's Wnl Gross Hearing WNL                Nose - Nares patent                 Throat - oropharanx wnl Respiratory - Lungs CTA bilateral Cardiac - RRR S1 and S2 without murmur GI - Abdomen soft Nontender and bowel sounds active x 4 Extremities - No edema. Neuro - Grossly intact. Results for orders placed in visit on 03/24/14  [facility]      Result Value Ref Range   Glucose 125 (*) 65 - 99 mg/dL   BUN 23  8 - 27 mg/dL   Creatinine, Ser 1.24 (*) 0.57 - 1.00 mg/dL   GFR calc non Af Amer 45 (*) >59 mL/min/1.73   GFR calc Af Amer 52 (*) >59 mL/min/1.73   BUN/Creatinine Ratio 19  11 - 26   Sodium 139  134 - 144 mmol/L   Potassium 3.6  3.5 - 5.2 mmol/L   Chloride 97  97 - 108 mmol/L   CO2 26  18 - 29 mmol/L   Calcium 10.0  8.7 - 10.3 mg/dL  POCT CBC      Result Value Ref Range   WBC 6.7  4.6 - 10.2 K/uL   Lymph, poc 1.8  0.6 - 3.4   POC LYMPH PERCENT 26.2  10 - 50 %L   POC Granulocyte 4.7  2 - 6.9   Granulocyte percent 70.5  37 - 80 %G   RBC 4.5  4.04 - 5.48 M/uL   Hemoglobin 12.9  12.2 - 16.2 g/dL   HCT, POC 38.9  37.7 - 47.9 %   MCV 86.0  80 - 97 fL   MCH, POC 28.5  27 - 31.2 pg   MCHC 33.1  31.8 - 35.4 g/dL   RDW, POC 13.4     Platelet Count, POC 247.0  142 - 424 K/uL   MPV 9.0  0 - 99.8 fL  POCT GLYCOSYLATED HEMOGLOBIN (HGB A1C)      Result Value Ref Range   Hemoglobin A1C 5.3         Assessment & Plan:  Type 2 diabetes mellitus without complication  Hypothyroidism, unspecified hypothyroidism type  Gastroesophageal reflux disease, esophagitis presence not specified - Plan: esomeprazole (NEXIUM) 40 MG capsule  Chronic pain syndrome - Plan: traMADol (ULTRAM-ER) 300 MG 24 hr tablet  Follow up in 3 months  Lysbeth Penner FNP

## 2014-03-31 ENCOUNTER — Other Ambulatory Visit: Payer: Self-pay | Admitting: Obstetrics & Gynecology

## 2014-03-31 DIAGNOSIS — N760 Acute vaginitis: Principal | ICD-10-CM

## 2014-03-31 DIAGNOSIS — B9689 Other specified bacterial agents as the cause of diseases classified elsewhere: Secondary | ICD-10-CM

## 2014-03-31 LAB — CERVICOVAGINAL ANCILLARY ONLY
BACTERIAL VAGINITIS: POSITIVE — AB
CANDIDA VAGINITIS: NEGATIVE

## 2014-03-31 MED ORDER — METRONIDAZOLE 500 MG PO TABS: 500.0000 mg | ORAL_TABLET | Freq: Two times a day (BID) | ORAL | Status: AC

## 2014-03-31 NOTE — Progress Notes (Addendum)
Called pt and left message that I am calling with test result and prescription information.  Please call back and state whether a detailed message can be left on her voice mail.  Pt returned my call several minutes later. I advised her of test results indicating BV and treatment required.  Pt voiced understanding.

## 2014-04-01 ENCOUNTER — Encounter: Payer: Medicare Other | Admitting: Physical Therapy

## 2014-04-03 ENCOUNTER — Ambulatory Visit: Payer: Medicare Other | Admitting: Physical Therapy

## 2014-04-03 DIAGNOSIS — IMO0001 Reserved for inherently not codable concepts without codable children: Secondary | ICD-10-CM | POA: Diagnosis not present

## 2014-04-07 ENCOUNTER — Telehealth: Payer: Self-pay | Admitting: *Deleted

## 2014-04-07 DIAGNOSIS — N760 Acute vaginitis: Principal | ICD-10-CM

## 2014-04-07 DIAGNOSIS — B9689 Other specified bacterial agents as the cause of diseases classified elsewhere: Secondary | ICD-10-CM

## 2014-04-07 MED ORDER — CLINDAMYCIN HCL 300 MG PO CAPS: 300.0000 mg | ORAL_CAPSULE | Freq: Two times a day (BID) | ORAL | Status: DC

## 2014-04-07 NOTE — Telephone Encounter (Signed)
Pt called in stating she did not want to take the Flagyl due to reading online that "it giave rats cancer". I spoke to Dr. Roselie Awkward and he gave verbal for Clindamycin. Will send to pharm.

## 2014-04-08 ENCOUNTER — Encounter: Payer: Medicare Other | Admitting: Physical Therapy

## 2014-04-09 ENCOUNTER — Other Ambulatory Visit: Payer: Self-pay | Admitting: Family Medicine

## 2014-04-09 ENCOUNTER — Telehealth: Payer: Self-pay | Admitting: Family Medicine

## 2014-04-10 ENCOUNTER — Ambulatory Visit: Payer: Medicare Other | Attending: Family Medicine | Admitting: Physical Therapy

## 2014-04-10 DIAGNOSIS — Z923 Personal history of irradiation: Secondary | ICD-10-CM | POA: Diagnosis not present

## 2014-04-10 DIAGNOSIS — I10 Essential (primary) hypertension: Secondary | ICD-10-CM | POA: Insufficient documentation

## 2014-04-10 DIAGNOSIS — M810 Age-related osteoporosis without current pathological fracture: Secondary | ICD-10-CM | POA: Diagnosis not present

## 2014-04-10 DIAGNOSIS — R5381 Other malaise: Secondary | ICD-10-CM | POA: Insufficient documentation

## 2014-04-10 DIAGNOSIS — E119 Type 2 diabetes mellitus without complications: Secondary | ICD-10-CM | POA: Insufficient documentation

## 2014-04-10 DIAGNOSIS — Z9221 Personal history of antineoplastic chemotherapy: Secondary | ICD-10-CM | POA: Diagnosis not present

## 2014-04-10 DIAGNOSIS — M797 Fibromyalgia: Secondary | ICD-10-CM | POA: Insufficient documentation

## 2014-04-10 DIAGNOSIS — C760 Malignant neoplasm of head, face and neck: Secondary | ICD-10-CM | POA: Insufficient documentation

## 2014-04-10 DIAGNOSIS — M5136 Other intervertebral disc degeneration, lumbar region: Secondary | ICD-10-CM | POA: Diagnosis not present

## 2014-04-10 DIAGNOSIS — M6281 Muscle weakness (generalized): Secondary | ICD-10-CM | POA: Diagnosis present

## 2014-04-11 ENCOUNTER — Other Ambulatory Visit: Payer: Self-pay | Admitting: General Practice

## 2014-04-15 ENCOUNTER — Encounter: Payer: Medicare Other | Admitting: *Deleted

## 2014-04-15 ENCOUNTER — Telehealth: Payer: Self-pay | Admitting: *Deleted

## 2014-04-15 DIAGNOSIS — N760 Acute vaginitis: Principal | ICD-10-CM

## 2014-04-15 DIAGNOSIS — B9689 Other specified bacterial agents as the cause of diseases classified elsewhere: Secondary | ICD-10-CM

## 2014-04-15 MED ORDER — METRONIDAZOLE 0.75 % VA GEL: 1.0000 | Freq: Every day | VAGINAL | Status: DC

## 2014-04-15 NOTE — Telephone Encounter (Signed)
Called pt to let her know I spoke to Dr. Ihor Dow and that she offered Metrogel. Pt was willing to use the gel versus the oral medication. I explained to pt that I will send in to pharm for her to pick up. Pt expressed understanding.

## 2014-04-15 NOTE — Telephone Encounter (Signed)
Pt called in stating she did not want to take Clindamycin because she "read about it and the article said deadly three times". She wanted to know if a Z-pac would be of any benefit for the BV. I explained to the pt that z-pac would not clear BV and as long as she is not allergic to the Flagyl or Clindamycin then she would need to take what has been Rx to clear BV. She wanted her records sent to Dr. Sallyanne Havers at [facility], I explained to the pt that we are all on the same system and the records were available for Dr. Sallyanne Havers to review. Pt expressed understanding.

## 2014-04-17 ENCOUNTER — Ambulatory Visit: Payer: Medicare Other | Admitting: *Deleted

## 2014-04-17 DIAGNOSIS — M6281 Muscle weakness (generalized): Secondary | ICD-10-CM | POA: Diagnosis not present

## 2014-04-21 ENCOUNTER — Ambulatory Visit: Payer: Medicare Other | Admitting: Obstetrics & Gynecology

## 2014-04-21 DIAGNOSIS — Z09 Encounter for follow-up examination after completed treatment for conditions other than malignant neoplasm: Secondary | ICD-10-CM

## 2014-04-22 ENCOUNTER — Ambulatory Visit: Payer: Medicare Other | Admitting: Physical Therapy

## 2014-04-22 ENCOUNTER — Encounter: Payer: Self-pay | Admitting: *Deleted

## 2014-04-22 DIAGNOSIS — M6281 Muscle weakness (generalized): Secondary | ICD-10-CM | POA: Diagnosis not present

## 2014-04-22 NOTE — Progress Notes (Signed)
I called pt to follow up on this [facility]-Nurse report - LMOM for pt to rtn call

## 2014-04-24 ENCOUNTER — Ambulatory Visit: Payer: Medicare Other | Admitting: Physical Therapy

## 2014-04-24 DIAGNOSIS — M6281 Muscle weakness (generalized): Secondary | ICD-10-CM | POA: Diagnosis not present

## 2014-04-28 ENCOUNTER — Telehealth: Payer: Self-pay | Admitting: Family Medicine

## 2014-05-06 ENCOUNTER — Encounter: Payer: Medicare Other | Admitting: Physical Therapy

## 2014-05-08 ENCOUNTER — Encounter: Payer: Medicare Other | Admitting: Physical Therapy

## 2014-05-13 ENCOUNTER — Encounter: Payer: Medicare Other | Admitting: Physical Therapy

## 2014-05-13 ENCOUNTER — Other Ambulatory Visit: Payer: Self-pay | Admitting: Family Medicine

## 2014-05-14 NOTE — Telephone Encounter (Signed)
Last seen 03/27/14, has 3 month follow up scheduled 06/27/14, rx was dc'd 03/2014 per pt preference

## 2014-05-15 ENCOUNTER — Encounter: Payer: Medicare Other | Admitting: Physical Therapy

## 2014-05-16 ENCOUNTER — Encounter: Payer: Self-pay | Admitting: *Deleted

## 2014-05-16 NOTE — Telephone Encounter (Signed)
RX called into [facility]

## 2014-05-16 NOTE — Telephone Encounter (Signed)
Please review and advise.

## 2014-05-20 ENCOUNTER — Ambulatory Visit: Payer: Medicare Other | Attending: Family Medicine | Admitting: Physical Therapy

## 2014-05-20 DIAGNOSIS — M5136 Other intervertebral disc degeneration, lumbar region: Secondary | ICD-10-CM | POA: Diagnosis not present

## 2014-05-20 DIAGNOSIS — Z923 Personal history of irradiation: Secondary | ICD-10-CM | POA: Diagnosis not present

## 2014-05-20 DIAGNOSIS — Z9221 Personal history of antineoplastic chemotherapy: Secondary | ICD-10-CM | POA: Insufficient documentation

## 2014-05-20 DIAGNOSIS — C760 Malignant neoplasm of head, face and neck: Secondary | ICD-10-CM | POA: Insufficient documentation

## 2014-05-20 DIAGNOSIS — E119 Type 2 diabetes mellitus without complications: Secondary | ICD-10-CM | POA: Insufficient documentation

## 2014-05-20 DIAGNOSIS — R5381 Other malaise: Secondary | ICD-10-CM | POA: Diagnosis not present

## 2014-05-20 DIAGNOSIS — M810 Age-related osteoporosis without current pathological fracture: Secondary | ICD-10-CM | POA: Insufficient documentation

## 2014-05-20 DIAGNOSIS — M6281 Muscle weakness (generalized): Secondary | ICD-10-CM | POA: Insufficient documentation

## 2014-05-20 DIAGNOSIS — I10 Essential (primary) hypertension: Secondary | ICD-10-CM | POA: Diagnosis not present

## 2014-05-20 DIAGNOSIS — M797 Fibromyalgia: Secondary | ICD-10-CM | POA: Insufficient documentation

## 2014-05-22 ENCOUNTER — Other Ambulatory Visit: Payer: Self-pay | Admitting: Family Medicine

## 2014-05-22 ENCOUNTER — Ambulatory Visit: Payer: Medicare Other | Admitting: Physical Therapy

## 2014-05-22 DIAGNOSIS — M6281 Muscle weakness (generalized): Secondary | ICD-10-CM | POA: Diagnosis not present

## 2014-06-11 ENCOUNTER — Other Ambulatory Visit: Payer: Self-pay | Admitting: Family Medicine

## 2014-06-27 ENCOUNTER — Encounter: Payer: Self-pay | Admitting: Family Medicine

## 2014-06-27 ENCOUNTER — Ambulatory Visit (INDEPENDENT_AMBULATORY_CARE_PROVIDER_SITE_OTHER): Payer: 59 | Admitting: Family Medicine

## 2014-06-27 VITALS — BP 105/71 | HR 70 | Ht 63.0 in | Wt 155.0 lb

## 2014-06-27 DIAGNOSIS — J069 Acute upper respiratory infection, unspecified: Secondary | ICD-10-CM

## 2014-06-27 DIAGNOSIS — B373 Candidiasis of vulva and vagina: Secondary | ICD-10-CM

## 2014-06-27 DIAGNOSIS — K589 Irritable bowel syndrome without diarrhea: Secondary | ICD-10-CM

## 2014-06-27 DIAGNOSIS — B3731 Acute candidiasis of vulva and vagina: Secondary | ICD-10-CM

## 2014-06-27 MED ORDER — DOXYCYCLINE HYCLATE 100 MG PO TABS: 100.0000 mg | ORAL_TABLET | Freq: Two times a day (BID) | ORAL | Status: DC

## 2014-06-27 MED ORDER — FLUCONAZOLE 150 MG PO TABS: 150.0000 mg | ORAL_TABLET | Freq: Once | ORAL | Status: DC

## 2014-06-27 MED ORDER — LINACLOTIDE 145 MCG PO CAPS: 145.0000 ug | ORAL_CAPSULE | Freq: Every day | ORAL | Status: AC

## 2014-06-27 NOTE — Progress Notes (Signed)
   Subjective:    Patient ID: Carla Watkins, female    DOB: [birth year], 68 y.o.   MRN: [id number]  HPI Patient is here for follow up.  She is having problems with fatigue.  She had cmp, cbc, tsh drawn at her oncologist at [place].  She has been having constipation and she is having uri sx's.  She gets frequent vaginal candidiasis.   Review of Systems  Constitutional: Negative for fever.  HENT: Negative for ear pain.   Eyes: Negative for discharge.  Respiratory: Negative for cough.   Cardiovascular: Negative for chest pain.  Gastrointestinal: Negative for abdominal distention.  Endocrine: Negative for polyuria.  Genitourinary: Negative for difficulty urinating.  Musculoskeletal: Negative for gait problem and neck pain.  Skin: Negative for color change and rash.  Neurological: Negative for speech difficulty and headaches.  Psychiatric/Behavioral: Negative for agitation.       Objective:    BP 105/71 mmHg  Pulse 70  Ht 5\' 3"  (1.6 m)  Wt 155 lb (70.308 kg)  BMI 27.46 kg/m2 Physical Exam  Constitutional: She is oriented to person, place, and time. She appears well-developed and well-nourished.  HENT:  Head: Normocephalic and atraumatic.  Mouth/Throat: Oropharynx is clear and moist.  Eyes: Pupils are equal, round, and reactive to light.  Neck: Normal range of motion. Neck supple.  Cardiovascular: Normal rate and regular rhythm.   No murmur heard. Pulmonary/Chest: Effort normal and breath sounds normal.  Abdominal: Soft. Bowel sounds are normal. There is no tenderness.  Neurological: She is alert and oriented to person, place, and time.  Skin: Skin is warm and dry.  Psychiatric: She has a normal mood and affect.          Assessment & Plan:     ICD-9-CM ICD-10-CM   1. IBS (irritable bowel syndrome) 564.1 K58.9 Linaclotide (LINZESS) 145 MCG CAPS capsule  2. Vaginal candidiasis 112.1 B37.3 fluconazole (DIFLUCAN) 150 MG tablet  3. URI (upper respiratory infection) 465.9  J06.9 doxycycline (VIBRA-TABS) 100 MG tablet     No Follow-up on file.  Lysbeth Penner FNP

## 2014-07-02 ENCOUNTER — Telehealth: Payer: Self-pay | Admitting: Family Medicine

## 2014-07-15 ENCOUNTER — Other Ambulatory Visit: Payer: Self-pay | Admitting: Family Medicine

## 2014-07-28 ENCOUNTER — Encounter: Payer: Self-pay | Admitting: Obstetrics & Gynecology

## 2014-07-28 ENCOUNTER — Ambulatory Visit (INDEPENDENT_AMBULATORY_CARE_PROVIDER_SITE_OTHER): Payer: Medicare Other | Admitting: Obstetrics & Gynecology

## 2014-07-28 VITALS — BP 124/60 | Wt 164.0 lb

## 2014-07-28 DIAGNOSIS — N939 Abnormal uterine and vaginal bleeding, unspecified: Secondary | ICD-10-CM

## 2014-07-28 NOTE — Progress Notes (Signed)
Patient ID: Carla Watkins, female   DOB: 07/13/1945, 69 y.o.   MRN: [id number] Pt has had small amount of vaginal bleeding since July, daily browninsh Hysterectomy 1984  Had evaluation in September negative  No burning with urination, frequency or urgency No nausea, vomiting or diarrhea Nor fever chills or other constitutional symptoms   Blood pressure 124/60, weight 164 lb (74.39 kg).  Exam Vulva normal atrophic slight monilla Vagina no source of bleeding no masses or polyps +BV Cuff intact  GV placed Metronidazole 500 BID x 7 days Follow up 7 days

## 2014-07-30 MED ORDER — METRONIDAZOLE 500 MG PO TABS: 500.0000 mg | ORAL_TABLET | Freq: Two times a day (BID) | ORAL | Status: DC

## 2014-07-30 NOTE — Addendum Note (Signed)
Addended by: Florian Buff on: 07/30/2014 03:35 PM   Modules accepted: Orders

## 2014-08-05 ENCOUNTER — Other Ambulatory Visit: Payer: Self-pay | Admitting: Family Medicine

## 2014-08-06 NOTE — Telephone Encounter (Signed)
Last seen 06/27/14 B [place]   If approved print and route to nurse

## 2014-08-06 NOTE — Telephone Encounter (Signed)
Left message that refill for Tramadol is at the office to be picked up

## 2014-08-18 ENCOUNTER — Encounter: Payer: Self-pay | Admitting: Obstetrics & Gynecology

## 2014-08-18 ENCOUNTER — Ambulatory Visit (INDEPENDENT_AMBULATORY_CARE_PROVIDER_SITE_OTHER): Payer: Medicare Other | Admitting: Obstetrics & Gynecology

## 2014-08-18 VITALS — BP 120/70 | Wt 157.4 lb

## 2014-08-18 DIAGNOSIS — N760 Acute vaginitis: Secondary | ICD-10-CM

## 2014-08-18 DIAGNOSIS — A499 Bacterial infection, unspecified: Secondary | ICD-10-CM

## 2014-08-18 DIAGNOSIS — B9689 Other specified bacterial agents as the cause of diseases classified elsewhere: Secondary | ICD-10-CM

## 2014-08-18 NOTE — Progress Notes (Signed)
Patient ID: Carla Watkins, female   DOB: May 28, 1946, 69 y.o.   MRN: [id number] Patient ID: Carla Watkins, female   DOB: Jun 11, 1946, 69 y.o.   MRN: [id number] Pt has had small amount of vaginal bleeding since July, daily browninsh Hysterectomy 1984  Had evaluation in September negative  No burning with urination, frequency or urgency No nausea, vomiting or diarrhea Nor fever chills or other constitutional symptoms   Blood pressure 120/70, weight 157 lb 6.4 oz (71.396 kg).  Exam Vulva normal atrophic slight monilla Vagina no source of bleeding no masses or polyps +BV Cuff intact  GV placed Metronidazole 500 BID x 7 days Follow up 7 days  Resolved on meds above Follow up prn  No on going management needed

## 2014-08-30 ENCOUNTER — Other Ambulatory Visit: Payer: Self-pay | Admitting: Family Medicine

## 2014-09-01 NOTE — Telephone Encounter (Signed)
Last seen 06/27/14 B [place]  Last lipid 10/22/13

## 2014-09-10 ENCOUNTER — Other Ambulatory Visit: Payer: Self-pay | Admitting: Family Medicine

## 2014-09-12 ENCOUNTER — Ambulatory Visit: Payer: Medicare Other | Admitting: Family Medicine

## 2014-09-29 ENCOUNTER — Telehealth: Payer: Self-pay | Admitting: Family Medicine

## 2014-09-29 NOTE — Telephone Encounter (Signed)
Patient has appt else wheres

## 2014-09-29 NOTE — Telephone Encounter (Signed)
Carla Watkins called to let triage know she got appt at her other doctor and thanks for trying to get her in here.

## 2014-10-01 ENCOUNTER — Ambulatory Visit: Payer: Medicare Other | Admitting: Family Medicine

## 2014-10-14 ENCOUNTER — Other Ambulatory Visit: Payer: Self-pay | Admitting: Family Medicine

## 2014-10-29 ENCOUNTER — Other Ambulatory Visit: Payer: Self-pay

## 2014-10-29 MED ORDER — PRAVASTATIN SODIUM 10 MG PO TABS: 10.0000 mg | ORAL_TABLET | Freq: Every day | ORAL | Status: DC

## 2014-10-29 MED ORDER — NABUMETONE 750 MG PO TABS: 750.0000 mg | ORAL_TABLET | Freq: Two times a day (BID) | ORAL | Status: DC

## 2014-10-29 NOTE — Telephone Encounter (Signed)
Requesting 90 day supply  Last seen 06/27/14 B [place]  Last lipid 10/22/13

## 2014-10-29 NOTE — Telephone Encounter (Signed)
Please refill the prescription and make an appointment for the patient to see Dr. Livia Snellen for follow-up

## 2014-10-29 NOTE — Telephone Encounter (Signed)
Last seen 06/27/14 B [place]  This med was not on [facility]

## 2014-10-29 NOTE — Telephone Encounter (Signed)
This medication is okay 1

## 2014-10-30 ENCOUNTER — Ambulatory Visit (INDEPENDENT_AMBULATORY_CARE_PROVIDER_SITE_OTHER): Payer: Medicare Other | Admitting: Family Medicine

## 2014-10-30 ENCOUNTER — Encounter: Payer: Self-pay | Admitting: Family Medicine

## 2014-10-30 VITALS — BP 92/55 | HR 63 | Temp 97.6°F | Ht 63.0 in | Wt 152.0 lb

## 2014-10-30 DIAGNOSIS — E785 Hyperlipidemia, unspecified: Secondary | ICD-10-CM

## 2014-10-30 DIAGNOSIS — E119 Type 2 diabetes mellitus without complications: Secondary | ICD-10-CM | POA: Diagnosis not present

## 2014-10-30 NOTE — Patient Instructions (Signed)

## 2014-10-30 NOTE — Progress Notes (Signed)
Subjective:    Patient ID: Carla Watkins, female    DOB: 04-09-1946, 69 y.o.   MRN: [id number]  HPI 69 year old female here for physical. 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 She has lost some 60 pounds in the process of treatment but now has some lymphadenopathy on the right side of her neck which is the opposite side where the cancers were and she is to undergo further scanning next week to see if there may be another malignancy. She does not have much of an appetite but has not had much since the first diagnosis of cancer. With her weight loss her diabetes has disappeared. She is bothered with chronic constipation and takes Linzess from her OB/GYN. She is up-to-date on mammograms. She had a hysterectomy in the distant past but has had Pap smears since then. We decided today that she probably does not need further Pap smears unless the oncologist thinks that's appropriate    Review of Systems  Constitutional: Positive for unexpected weight change.  Respiratory: Negative.   Cardiovascular: Negative.   Genitourinary: Negative.   Psychiatric/Behavioral: Negative.        Objective:   Physical Exam  Constitutional: She is oriented to person, place, and time. She appears well-developed and well-nourished.  HENT:  Head: Normocephalic.  Neck: Normal range of motion.  Cardiovascular: Normal rate and regular rhythm.   Pulmonary/Chest: Effort normal and breath sounds normal.  Abdominal: Soft.  Musculoskeletal: Normal range of motion.  Neurological: She is alert and oriented to person, place, and time. She has normal reflexes.  Psychiatric: She has a normal mood and affect. Her behavior is normal.          Assessment & Plan:  1. Type 2 diabetes mellitus without complication No current evidence of diabetes with weight loss  2. Hyperlipidemia I would predict this may have gone away with weight loss as the diabetes. But we will check today to be  sure - Lipid panel - CMP14+EGFR  3. Hypertension She is currently on a regimen which basically includes 4 different antihypertensive agents. Blood pressure is lower today than her typical pressure and I have suggested that she reduce Bystolic from 10 mg to 5 mg and follow pressure if it still does not come up to around 295 systolic I would discontinue the Bystolic at that point  Wardell Honour MD  Patient Active Problem List   Diagnosis Date Noted  . Hyperlipidemia 10/30/2014  . Adult hypothyroidism 03/27/2014  . Acid reflux 03/27/2014  . Cancer of head, face, or neck 01/24/2013  . Diabetes 01/24/2013   Outpatient Encounter Prescriptions as of 10/30/2014  Medication Sig  . albuterol (PROVENTIL HFA;VENTOLIN HFA) 108 (90 BASE) MCG/ACT inhaler Inhale 2 puffs into the lungs every 6 (six) hours as needed for wheezing or shortness of breath.  . ALPRAZolam (XANAX) 0.5 MG tablet TAKE 1/2 TO 1 TABLET BY MOUTH AT BEDTIME AS NEEDED AS INSTRUCTED  . Amlodipine-Valsartan-HCTZ 10-320-25 MG TABS TAKE ONE TABLET BY MOUTH ONE TIME DAILY  . BIOTIN 5000 PO Take by mouth daily.    Marland Kitchen BRINTELLIX 10 MG TABS TAKE ONE TABLET BY MOUTH ONE TIME DAILY  . busPIRone (BUSPAR) 10 MG tablet TAKE ONE TABLET BY MOUTH THREE TIMES DAILY  . esomeprazole (NEXIUM) 40 MG capsule TAKE ONE CAPSULE BY MOUTH ONE TIME DAILY  . Flaxseed, Linseed, (FLAX SEED OIL PO) Take by mouth.  Marland Kitchen glucose blood (FREESTYLE TEST STRIPS) test strip Check  blood sugar once daily and prn. Dx Code 250.00  . Lancets (FREESTYLE) lancets Check Blood sugar once daily and prn. Dx Code 250.00  . Linaclotide (LINZESS) 145 MCG CAPS capsule Take 1 capsule (145 mcg total) by mouth daily.  . Melatonin 1 MG CAPS Take by mouth.  . nebivolol (BYSTOLIC) 10 MG tablet Take 1 tablet (10 mg total) by mouth daily.  . pravastatin (PRAVACHOL) 10 MG tablet Take 1 tablet (10 mg total) by mouth daily.  . promethazine (PHENERGAN) 25 MG tablet Take 1 tablet (25 mg total) by  mouth every 8 (eight) hours as needed for nausea or vomiting.  . traMADol (ULTRAM-ER) 300 MG 24 hr tablet TAKE ONE TABLET BY MOUTH ONE TIME DAILY  . TURMERIC PO Take 500 mcg by mouth daily.    . VESICARE 10 MG tablet TAKE ONE TABLET BY MOUTH ONE TIME DAILY  . [DISCONTINUED] Multiple Vitamin (MULTIVITAMIN) tablet Take 1 tablet by mouth daily.    . [DISCONTINUED] doxycycline (VIBRA-TABS) 100 MG tablet Take 1 tablet (100 mg total) by mouth 2 (two) times daily. (Patient not taking: Reported on 07/28/2014)  . [DISCONTINUED] fluconazole (DIFLUCAN) 150 MG tablet Take 1 tablet (150 mg total) by mouth once. (Patient not taking: Reported on 07/28/2014)  . [DISCONTINUED] guaiFENesin (MUCINEX) 600 MG 12 hr tablet Take 1,200 mg by mouth.  . [DISCONTINUED] lidocaine-prilocaine (EMLA) cream Apply topically.  . [DISCONTINUED] metroNIDAZOLE (FLAGYL) 500 MG tablet Take 1 tablet (500 mg total) by mouth 2 (two) times daily.  . [DISCONTINUED] nabumetone (RELAFEN) 750 MG tablet Take 1 tablet (750 mg total) by mouth 2 (two) times daily.  . [DISCONTINUED] ondansetron (ZOFRAN) 8 MG tablet

## 2014-10-31 LAB — CMP14+EGFR
ALBUMIN: 4.2 g/dL (ref 3.6–4.8)
ALT: 15 IU/L (ref 0–32)
AST: 14 IU/L (ref 0–40)
Albumin/Globulin Ratio: 2.3 (ref 1.1–2.5)
Alkaline Phosphatase: 89 IU/L (ref 39–117)
BUN/Creatinine Ratio: 28 — ABNORMAL HIGH (ref 11–26)
BUN: 30 mg/dL — AB (ref 8–27)
Bilirubin Total: 0.2 mg/dL (ref 0.0–1.2)
CO2: 26 mmol/L (ref 18–29)
Calcium: 9.2 mg/dL (ref 8.7–10.3)
Chloride: 100 mmol/L (ref 97–108)
Creatinine, Ser: 1.08 mg/dL — ABNORMAL HIGH (ref 0.57–1.00)
GFR calc Af Amer: 61 mL/min/{1.73_m2} (ref >59)
GFR calc non Af Amer: 53 mL/min/{1.73_m2} — ABNORMAL LOW (ref >59)
GLUCOSE: 76 mg/dL (ref 65–99)
Globulin, Total: 1.8 g/dL (ref 1.5–4.5)
POTASSIUM: 3.6 mmol/L (ref 3.5–5.2)
Sodium: 140 mmol/L (ref 134–144)
TOTAL PROTEIN: 6 g/dL (ref 6.0–8.5)

## 2014-10-31 LAB — LIPID PANEL
CHOL/HDL RATIO: 2.7 ratio (ref 0.0–4.4)
Cholesterol, Total: 189 mg/dL (ref 100–199)
HDL: 70 mg/dL (ref >39)
LDL Calculated: 82 mg/dL (ref 0–99)
TRIGLYCERIDES: 187 mg/dL — AB (ref 0–149)
VLDL Cholesterol Cal: 37 mg/dL (ref 5–40)

## 2014-11-05 ENCOUNTER — Other Ambulatory Visit: Payer: Self-pay | Admitting: Nurse Practitioner

## 2014-11-05 ENCOUNTER — Telehealth: Payer: Self-pay | Admitting: *Deleted

## 2014-11-05 MED ORDER — MEPERIDINE HCL 50 MG PO TABS: 50.0000 mg | ORAL_TABLET | ORAL | Status: DC | PRN

## 2014-11-05 NOTE — Telephone Encounter (Signed)
Who called Dr. Selinda Flavin office or patient?

## 2014-11-05 NOTE — Telephone Encounter (Signed)
rx written for pick up- was dr. Silvio Clayman approved [place] to do this Dr. Silvio Clayman office aware as well as patient

## 2014-11-14 ENCOUNTER — Telehealth: Payer: Self-pay | Admitting: Family Medicine

## 2014-11-15 ENCOUNTER — Other Ambulatory Visit: Payer: Self-pay | Admitting: Family Medicine

## 2014-11-26 ENCOUNTER — Other Ambulatory Visit: Payer: Self-pay | Admitting: *Deleted

## 2014-11-26 MED ORDER — VORTIOXETINE HBR 10 MG PO TABS: 15.0000 mg | ORAL_TABLET | Freq: Every day | ORAL | Status: DC

## 2014-11-26 NOTE — Telephone Encounter (Signed)
Patient asked pharmacy to request increase to 20mg . Discussed with Dr Sabra Heck. He would like to increase to 15mg  now and may increase to 20mg  in the future if necessary.

## 2014-11-28 ENCOUNTER — Other Ambulatory Visit: Payer: Self-pay | Admitting: *Deleted

## 2014-11-28 MED ORDER — TRAMADOL HCL ER 300 MG PO TB24: 300.0000 mg | ORAL_TABLET | Freq: Every day | ORAL | Status: DC

## 2014-11-28 NOTE — Progress Notes (Signed)
Request from [facility] for Tramadol Okayed per Dr Vista Lawman printed and to the front for pt pick up

## 2014-12-04 ENCOUNTER — Telehealth: Payer: Self-pay

## 2014-12-04 NOTE — Telephone Encounter (Signed)
Insurance will only pay for 1 pill daily of Brintellix so that is why patient would like it increased to 20mg  and then it will cover one 20 mg pill a day

## 2014-12-04 NOTE — Telephone Encounter (Signed)
Patient would like to increase to Brintellix 20mg . We increased to 15mg  which is 1.5 tablets but her insurance won't cover it that way. They will cover a 20 mg tablet daily. Is it ok to increase to 20mg ?

## 2014-12-09 MED ORDER — VORTIOXETINE HBR 20 MG PO TABS: 20.0000 mg | ORAL_TABLET | Freq: Every day | ORAL | Status: DC

## 2014-12-09 NOTE — Telephone Encounter (Signed)
Okay to increase?

## 2014-12-10 ENCOUNTER — Telehealth: Payer: Self-pay | Admitting: Family Medicine

## 2014-12-11 NOTE — Telephone Encounter (Signed)
Detailed message left for patient that we do not have samples.

## 2014-12-15 ENCOUNTER — Encounter (INDEPENDENT_AMBULATORY_CARE_PROVIDER_SITE_OTHER): Payer: Self-pay

## 2014-12-15 ENCOUNTER — Ambulatory Visit (INDEPENDENT_AMBULATORY_CARE_PROVIDER_SITE_OTHER): Payer: Medicare Other | Admitting: Physician Assistant

## 2014-12-15 ENCOUNTER — Encounter: Payer: Self-pay | Admitting: Physician Assistant

## 2014-12-15 VITALS — BP 134/75 | HR 82 | Temp 97.2°F | Ht 63.0 in | Wt 151.0 lb

## 2014-12-15 DIAGNOSIS — L089 Local infection of the skin and subcutaneous tissue, unspecified: Secondary | ICD-10-CM | POA: Diagnosis not present

## 2014-12-15 DIAGNOSIS — L309 Dermatitis, unspecified: Secondary | ICD-10-CM | POA: Diagnosis not present

## 2014-12-15 MED ORDER — HYDROXYZINE HCL 25 MG PO TABS: 25.0000 mg | ORAL_TABLET | Freq: Three times a day (TID) | ORAL | Status: DC | PRN

## 2014-12-15 MED ORDER — TRIAMCINOLONE ACETONIDE 0.1 % EX CREA: 1.0000 "application " | TOPICAL_CREAM | Freq: Two times a day (BID) | CUTANEOUS | Status: DC

## 2014-12-15 NOTE — Progress Notes (Signed)
   Subjective:    Patient ID: Carla Watkins, female    DOB: 1946-04-01, 69 y.o.   MRN: [id number]  HPI 69 y/o female presents with c/o rash on back since she was in the hospital 3 weeks ago. She has using otc hydrocortisone with no relief. Pruritic. No aggravating or relieving factors. Uses [place] liquid soap. 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 She appears well-developed and well-nourished.  Neurological: She is alert and oriented to person, place, and time.  Skin: Rash (localized area of mild scaling on left upper back. Appears to be localized area of eczema) noted. There is erythema.  Psychiatric: She has a normal mood and affect. Her behavior is normal. Judgment and thought content normal.  Nursing note and vitals reviewed.         Assessment & Plan:  1. Dermatitis  - hydrOXYzine (ATARAX/VISTARIL) 25 MG tablet; Take 1 tablet (25 mg total) by mouth every 8 (eight) hours as needed for itching.  Dispense: 30 tablet; Refill: 1 - triamcinolone cream (KENALOG) 0.1 %; Apply 1 application topically 2 (two) times daily.  Dispense: 30 g; Refill: 0  2. Skin infection  - Aerobic culture   Continue all meds Labs pending Health Maintenance reviewed Diet and exercise encouraged RTO 2 weeks   Tiffany A. Benjamin Stain PA-C

## 2014-12-15 NOTE — Patient Instructions (Signed)
USE DOVE SOAP FOR SENSITIVE SKIN    Contact Dermatitis Contact dermatitis is a reaction to certain substances that touch the skin. Contact dermatitis can be either irritant contact dermatitis or allergic contact dermatitis. Irritant contact dermatitis does not require previous exposure to the substance for a reaction to occur.Allergic contact dermatitis only occurs if you have been exposed to the substance before. Upon a repeat exposure, your body reacts to the substance.  CAUSES  Many substances can cause contact dermatitis. Irritant dermatitis is most commonly caused by repeated exposure to mildly irritating substances, such as:  Makeup.  Soaps.  Detergents.  Bleaches.  Acids.  Metal salts, such as nickel. Allergic contact dermatitis is most commonly caused by exposure to:  Poisonous plants.  Chemicals (deodorants, shampoos).  Jewelry.  Latex.  Neomycin in triple antibiotic cream.  Preservatives in products, including clothing. SYMPTOMS  The area of skin that is exposed may develop:  Dryness or flaking.  Redness.  Cracks.  Itching.  Pain or a burning sensation.  Blisters. With allergic contact dermatitis, there may also be swelling in areas such as the eyelids, mouth, or genitals.  DIAGNOSIS  Your caregiver can usually tell what the problem is by doing a physical exam. In cases where the cause is uncertain and an allergic contact dermatitis is suspected, a patch skin test may be performed to help determine the cause of your dermatitis. TREATMENT Treatment includes protecting the skin from further contact with the irritating substance by avoiding that substance if possible. Barrier creams, powders, and gloves may be helpful. Your caregiver may also recommend:  Steroid creams or ointments applied 2 times daily. For best results, soak the rash area in cool water for 20 minutes. Then apply the medicine. Cover the area with a plastic wrap. You can store the steroid  cream in the refrigerator for a "chilly" effect on your rash. That may decrease itching. Oral steroid medicines may be needed in more severe cases.  Antibiotics or antibacterial ointments if a skin infection is present.  Antihistamine lotion or an antihistamine taken by mouth to ease itching.  Lubricants to keep moisture in your skin.  Burow's solution to reduce redness and soreness or to dry a weeping rash. Mix one packet or tablet of solution in 2 cups cool water. Dip a clean washcloth in the mixture, wring it out a bit, and put it on the affected area. Leave the cloth in place for 30 minutes. Do this as often as possible throughout the day.  Taking several cornstarch or baking soda baths daily if the area is too large to cover with a washcloth. Harsh chemicals, such as alkalis or acids, can cause skin damage that is like a burn. You should flush your skin for 15 to 20 minutes with cold water after such an exposure. You should also seek immediate medical care after exposure. Bandages (dressings), antibiotics, and pain medicine may be needed for severely irritated skin.  HOME CARE INSTRUCTIONS  Avoid the substance that caused your reaction.  Keep the area of skin that is affected away from hot water, soap, sunlight, chemicals, acidic substances, or anything else that would irritate your skin.  Do not scratch the rash. Scratching may cause the rash to become infected.  You may take cool baths to help stop the itching.  Only take over-the-counter or prescription medicines as directed by your caregiver.  See your caregiver for follow-up care as directed to make sure your skin is healing properly. SEEK MEDICAL CARE IF:  Your condition is not better after 3 days of treatment.  You seem to be getting worse.  You see signs of infection such as swelling, tenderness, redness, soreness, or warmth in the affected area.  You have any problems related to your medicines. Document Released:  06/24/2000 Document Revised: 09/19/2011 Document Reviewed: 11/30/2010 [employer] Patient Information 2015 [place], [place]. This information is not intended to replace advice given to you by your health care provider. Make sure you discuss any questions you have with your health care provider.

## 2014-12-19 ENCOUNTER — Telehealth: Payer: Self-pay | Admitting: Family Medicine

## 2014-12-19 LAB — AEROBIC CULTURE

## 2014-12-19 NOTE — Telephone Encounter (Signed)
No samples - is it ok to approve 15 mg total a day on this pt??

## 2014-12-20 ENCOUNTER — Other Ambulatory Visit: Payer: Self-pay | Admitting: *Deleted

## 2014-12-20 MED ORDER — AMLODIPINE-VALSARTAN-HCTZ 10-320-25 MG PO TABS: 1.0000 | ORAL_TABLET | Freq: Every day | ORAL | Status: AC

## 2014-12-22 ENCOUNTER — Telehealth: Payer: Self-pay | Admitting: Physician Assistant

## 2014-12-22 MED ORDER — VORTIOXETINE HBR 10 MG PO TABS: ORAL_TABLET | ORAL | Status: AC

## 2014-12-22 NOTE — Telephone Encounter (Signed)
Approve Rx

## 2014-12-22 NOTE — Telephone Encounter (Signed)
Has she changed to [employer]? Next option would be to biopsy the area to determine possible causes. Ntbs sooner if rash is worsening. Mahogony Gilchrest A. Benjamin Stain PA-C

## 2014-12-22 NOTE — Telephone Encounter (Signed)
-----   Message from Adella Nissen, PA-C sent at 12/19/2014 11:56 PM EDT ----- Negative for secondary bacterial infection. Continue  Kenalog and atarax. Keep f/u in 2 weeks. Tiffany A. Benjamin Stain PA-C

## 2014-12-22 NOTE — Telephone Encounter (Signed)
Lmtcb regarding test results, will forward this message to Tiffany since it seems the pt's rash is worsening and not getting better on current regimen.

## 2014-12-24 ENCOUNTER — Encounter: Payer: Self-pay | Admitting: Physician Assistant

## 2014-12-24 ENCOUNTER — Telehealth: Payer: Self-pay | Admitting: Physician Assistant

## 2014-12-24 ENCOUNTER — Ambulatory Visit (INDEPENDENT_AMBULATORY_CARE_PROVIDER_SITE_OTHER): Payer: Medicare Other | Admitting: Physician Assistant

## 2014-12-24 ENCOUNTER — Other Ambulatory Visit: Payer: Self-pay | Admitting: Family Medicine

## 2014-12-24 VITALS — BP 107/60 | HR 102 | Ht 63.0 in | Wt 152.0 lb

## 2014-12-24 DIAGNOSIS — L309 Dermatitis, unspecified: Secondary | ICD-10-CM | POA: Diagnosis not present

## 2014-12-24 MED ORDER — TRIAMCINOLONE ACETONIDE 0.1 % EX CREA: 1.0000 "application " | TOPICAL_CREAM | Freq: Two times a day (BID) | CUTANEOUS | Status: AC

## 2014-12-24 MED ORDER — HYDROXYZINE HCL 25 MG PO TABS
25.0000 mg | ORAL_TABLET | Freq: Three times a day (TID) | ORAL | Status: DC | PRN
Start: 2014-12-24 — End: 2014-12-24

## 2014-12-24 MED ORDER — HYDROXYZINE HCL 25 MG PO TABS: 25.0000 mg | ORAL_TABLET | Freq: Three times a day (TID) | ORAL | Status: AC | PRN

## 2014-12-24 MED ORDER — FLUCONAZOLE 150 MG PO TABS: 150.0000 mg | ORAL_TABLET | Freq: Every day | ORAL | Status: AC

## 2014-12-24 NOTE — Progress Notes (Signed)
Subjective:     Patient ID: Carla Watkins, female   DOB: 1946/01/17, 69 y.o.   MRN: [id number]  HPI Pt here for f/u of rash to the midback area No pain to the site Prev seen by Marline Backbone She has used the Atrax and kenalog cream but sx continue She believes the area has increased in size  Review of Systems  Skin: Positive for color change and rash.       Objective:   Physical Exam Large well demarc. erythem area to the midback No surrounding induration No vesicles or ulcers seen Prev culture report reviewed    Assessment:     Dermatitis    Plan:     Pt has been dealing with this for 5 weeks now Given Ca hx and no change with med refer to Dermatology F/U prn

## 2014-12-24 NOTE — Patient Instructions (Signed)

## 2014-12-24 NOTE — Telephone Encounter (Signed)
Appt made today with Carla Watkins since Tiffany's schedule is full for today, and I had suggested tomorrow with her

## 2014-12-25 NOTE — Telephone Encounter (Signed)
Pt was seen in office by Osa Craver 12/24/14. Will close encounter.

## 2014-12-26 ENCOUNTER — Other Ambulatory Visit: Payer: Self-pay

## 2014-12-26 ENCOUNTER — Telehealth: Payer: Self-pay | Admitting: Family Medicine

## 2014-12-26 MED ORDER — TRAMADOL HCL ER 300 MG PO TB24: 300.0000 mg | ORAL_TABLET | Freq: Every day | ORAL | Status: DC

## 2014-12-26 NOTE — Telephone Encounter (Signed)
Last printed RX 11/28/14 1 R  Dr Sabra Heck

## 2014-12-26 NOTE — Telephone Encounter (Signed)
Patient aware script will be at front desk for pickup with photo ID

## 2014-12-30 ENCOUNTER — Ambulatory Visit: Payer: Self-pay | Admitting: Physical Therapy

## 2015-01-02 ENCOUNTER — Telehealth: Payer: Self-pay | Admitting: Family Medicine

## 2015-01-27 ENCOUNTER — Other Ambulatory Visit: Payer: Self-pay | Admitting: Family Medicine

## 2015-01-27 MED ORDER — TRAMADOL HCL ER 300 MG PO TB24: 300.0000 mg | ORAL_TABLET | Freq: Every day | ORAL | Status: DC

## 2015-01-27 NOTE — Telephone Encounter (Signed)
Last filled 12/26/14, last seen 10/30/14. Rx will print

## 2015-01-28 ENCOUNTER — Other Ambulatory Visit: Payer: Self-pay | Admitting: *Deleted

## 2015-01-28 MED ORDER — TRAMADOL HCL ER 300 MG PO TB24: 300.0000 mg | ORAL_TABLET | Freq: Every day | ORAL | Status: AC

## 2015-02-18 ENCOUNTER — Ambulatory Visit: Payer: Medicare Other | Admitting: Family Medicine

## 2015-03-11 ENCOUNTER — Other Ambulatory Visit: Payer: Self-pay | Admitting: Family Medicine

## 2015-03-12 MED ORDER — VORTIOXETINE HBR 20 MG PO TABS: 20.0000 mg | ORAL_TABLET | Freq: Every day | ORAL | Status: DC

## 2015-04-24 LAB — FECAL OCCULT BLOOD, GUAIAC: FECAL OCCULT BLD: NEGATIVE

## 2015-05-14 ENCOUNTER — Telehealth: Payer: Self-pay | Admitting: Family Medicine

## 2015-05-14 MED ORDER — PANTOPRAZOLE SODIUM 40 MG PO TBEC: 40.0000 mg | DELAYED_RELEASE_TABLET | Freq: Every day | ORAL | Status: AC

## 2015-05-14 NOTE — Telephone Encounter (Signed)
  Covering for PCP  Change to protonix per patients request for "stronger than nexium OTC"  Will ask nursing to inform.   Laroy Apple, MD [facility] 05/14/2015, 5:36 PM

## 2015-05-14 NOTE — Telephone Encounter (Signed)
Please address- for [employer]

## 2015-05-15 NOTE — Telephone Encounter (Signed)
Pt has denied wanting an apt

## 2015-05-15 NOTE — Telephone Encounter (Signed)
Detailed message left for patient with medication change.  

## 2015-05-18 ENCOUNTER — Other Ambulatory Visit: Payer: Self-pay | Admitting: *Deleted

## 2015-05-18 MED ORDER — ESOMEPRAZOLE MAGNESIUM 40 MG PO CPDR: 40.0000 mg | DELAYED_RELEASE_CAPSULE | Freq: Every day | ORAL | Status: AC

## 2015-06-16 ENCOUNTER — Telehealth: Payer: Self-pay | Admitting: Family Medicine

## 2015-06-18 ENCOUNTER — Other Ambulatory Visit: Payer: Self-pay | Admitting: *Deleted

## 2015-07-14 ENCOUNTER — Other Ambulatory Visit: Payer: Self-pay | Admitting: Family Medicine

## 2015-07-15 NOTE — Telephone Encounter (Signed)
Last seen 12/24/14  [facility] 

## 2015-07-22 ENCOUNTER — Telehealth: Payer: Self-pay | Admitting: *Deleted

## 2015-07-29 ENCOUNTER — Telehealth: Payer: Self-pay | Admitting: Family Medicine

## 2015-08-05 ENCOUNTER — Encounter: Payer: Self-pay | Admitting: *Deleted

## 2015-08-12 DEATH — deceased

## 2015-11-27 NOTE — Telephone Encounter (Signed)
Erroneous encounter
